# Patient Record
Sex: Female | Born: 1965
Health system: Southern US, Community
[De-identification: ages and names within clinical notes are randomized; demographics above are authoritative.]

## PROBLEM LIST (undated history)

## (undated) DIAGNOSIS — N979 Female infertility, unspecified: Secondary | ICD-10-CM

## (undated) DIAGNOSIS — K219 Gastro-esophageal reflux disease without esophagitis: Secondary | ICD-10-CM

## (undated) DIAGNOSIS — J302 Other seasonal allergic rhinitis: Secondary | ICD-10-CM

## (undated) DIAGNOSIS — F419 Anxiety disorder, unspecified: Secondary | ICD-10-CM

## (undated) DIAGNOSIS — S82831A Other fracture of upper and lower end of right fibula, initial encounter for closed fracture: Secondary | ICD-10-CM

## (undated) DIAGNOSIS — Z8619 Personal history of other infectious and parasitic diseases: Secondary | ICD-10-CM

## (undated) DIAGNOSIS — I499 Cardiac arrhythmia, unspecified: Secondary | ICD-10-CM

## (undated) DIAGNOSIS — J45909 Unspecified asthma, uncomplicated: Secondary | ICD-10-CM

## (undated) DIAGNOSIS — J309 Allergic rhinitis, unspecified: Secondary | ICD-10-CM

## (undated) HISTORY — DX: Other fracture of upper and lower end of right fibula, initial encounter for closed fracture: S82.831A

## (undated) HISTORY — DX: Allergic rhinitis, unspecified: J30.9

## (undated) HISTORY — PX: ANKLE FRACTURE SURGERY: SHX122

## (undated) HISTORY — DX: Gastro-esophageal reflux disease without esophagitis: K21.9

## (undated) HISTORY — PX: COLONOSCOPY: SHX174

## (undated) HISTORY — DX: Unspecified asthma, uncomplicated: J45.909

---

## 1986-11-12 HISTORY — PX: WISDOM TOOTH EXTRACTION: SHX21

## 2000-11-12 HISTORY — PX: CHOLECYSTECTOMY: SHX55

## 2001-03-10 ENCOUNTER — Inpatient Hospital Stay (HOSPITAL_COMMUNITY): Admission: AD | Admit: 2001-03-10 | Discharge: 2001-03-13 | Payer: Self-pay | Admitting: Obstetrics and Gynecology

## 2001-03-14 ENCOUNTER — Encounter: Payer: Self-pay | Admitting: Obstetrics and Gynecology

## 2001-03-14 ENCOUNTER — Inpatient Hospital Stay (HOSPITAL_COMMUNITY): Admission: AD | Admit: 2001-03-14 | Discharge: 2001-03-14 | Payer: Self-pay | Admitting: Obstetrics and Gynecology

## 2001-03-18 ENCOUNTER — Encounter: Admission: RE | Admit: 2001-03-18 | Discharge: 2001-04-17 | Payer: Self-pay | Admitting: Obstetrics and Gynecology

## 2001-04-18 ENCOUNTER — Encounter: Admission: RE | Admit: 2001-04-18 | Discharge: 2001-05-18 | Payer: Self-pay | Admitting: Obstetrics and Gynecology

## 2001-06-16 ENCOUNTER — Ambulatory Visit (HOSPITAL_COMMUNITY): Admission: RE | Admit: 2001-06-16 | Discharge: 2001-06-16 | Payer: Self-pay | Admitting: *Deleted

## 2001-06-16 ENCOUNTER — Encounter: Payer: Self-pay | Admitting: *Deleted

## 2001-06-18 ENCOUNTER — Encounter: Admission: RE | Admit: 2001-06-18 | Discharge: 2001-07-18 | Payer: Self-pay | Admitting: Obstetrics and Gynecology

## 2001-06-27 ENCOUNTER — Encounter (INDEPENDENT_AMBULATORY_CARE_PROVIDER_SITE_OTHER): Payer: Self-pay | Admitting: *Deleted

## 2001-06-27 ENCOUNTER — Ambulatory Visit (HOSPITAL_COMMUNITY): Admission: RE | Admit: 2001-06-27 | Discharge: 2001-06-28 | Payer: Self-pay | Admitting: *Deleted

## 2001-07-19 ENCOUNTER — Encounter: Admission: RE | Admit: 2001-07-19 | Discharge: 2001-08-18 | Payer: Self-pay | Admitting: Obstetrics and Gynecology

## 2001-09-18 ENCOUNTER — Encounter: Admission: RE | Admit: 2001-09-18 | Discharge: 2001-10-18 | Payer: Self-pay | Admitting: Obstetrics and Gynecology

## 2001-11-18 ENCOUNTER — Encounter: Admission: RE | Admit: 2001-11-18 | Discharge: 2001-12-18 | Payer: Self-pay | Admitting: Obstetrics and Gynecology

## 2001-12-19 ENCOUNTER — Encounter: Admission: RE | Admit: 2001-12-19 | Discharge: 2002-01-18 | Payer: Self-pay | Admitting: Obstetrics and Gynecology

## 2002-04-20 ENCOUNTER — Other Ambulatory Visit: Admission: RE | Admit: 2002-04-20 | Discharge: 2002-04-20 | Payer: Self-pay | Admitting: Obstetrics and Gynecology

## 2003-04-22 ENCOUNTER — Other Ambulatory Visit: Admission: RE | Admit: 2003-04-22 | Discharge: 2003-04-22 | Payer: Self-pay | Admitting: Obstetrics and Gynecology

## 2004-08-22 ENCOUNTER — Emergency Department (HOSPITAL_COMMUNITY): Admission: EM | Admit: 2004-08-22 | Discharge: 2004-08-23 | Payer: Self-pay | Admitting: Emergency Medicine

## 2006-07-04 ENCOUNTER — Emergency Department (HOSPITAL_COMMUNITY): Admission: EM | Admit: 2006-07-04 | Discharge: 2006-07-04 | Payer: Self-pay | Admitting: Emergency Medicine

## 2010-08-31 ENCOUNTER — Emergency Department (HOSPITAL_COMMUNITY): Admission: EM | Admit: 2010-08-31 | Discharge: 2010-08-31 | Payer: Self-pay | Admitting: Emergency Medicine

## 2010-10-27 ENCOUNTER — Emergency Department (HOSPITAL_BASED_OUTPATIENT_CLINIC_OR_DEPARTMENT_OTHER)
Admission: EM | Admit: 2010-10-27 | Discharge: 2010-10-27 | Payer: Self-pay | Source: Home / Self Care | Admitting: Emergency Medicine

## 2010-10-29 ENCOUNTER — Inpatient Hospital Stay (HOSPITAL_COMMUNITY)
Admission: EM | Admit: 2010-10-29 | Discharge: 2010-10-30 | Payer: Self-pay | Source: Home / Self Care | Attending: Orthopaedic Surgery | Admitting: Orthopaedic Surgery

## 2010-11-09 ENCOUNTER — Encounter (INDEPENDENT_AMBULATORY_CARE_PROVIDER_SITE_OTHER): Payer: Self-pay | Admitting: Family Medicine

## 2010-11-09 ENCOUNTER — Ambulatory Visit (HOSPITAL_COMMUNITY)
Admission: RE | Admit: 2010-11-09 | Discharge: 2010-11-09 | Payer: Self-pay | Source: Home / Self Care | Attending: Orthopedic Surgery | Admitting: Orthopedic Surgery

## 2010-12-08 NOTE — Op Note (Signed)
Nancy, Richards                ACCOUNT NO.:  [id number]  MEDICAL RECORD NO.:  [id number]          PATIENT TYPE:  INP  LOCATION:  5009                         FACILITY:  [facility]  PHYSICIAN:  Nancy Richards, M.D.DATE OF BIRTH:  December 27, 1965  DATE OF PROCEDURE:  10/29/2010 DATE OF DISCHARGE:                              OPERATIVE REPORT   PREOPERATIVE DIAGNOSIS:  Displaced right distal fibular fracture with diastasis of distal tib-fib joint and tear of deltoid ligament.  POSTOPERATIVE DIAGNOSIS:  Displaced right distal fibular fracture with diastasis of distal tib-fib joint and tear of deltoid ligament.  PROCEDURES: 1. Open reduction internal fixation of distal fibula. 2. Repair of deltoid ligament. 3. Open reduction internal fixation of diastasis.  SURGEON:  Nancy Manges. Cleophas Dunker, MD  ASSISTANT:  Nancy Richards, PAC  ANESTHESIA:  General.  COMPLICATIONS:  None.  DESCRIPTION OF PROCEDURE:  Nancy Richards was met in the holding area.  She was in a posterior splint.  The skin was checked.  There were no open areas or blisters.  Neurovascular exam was intact to the foot.  I marked the right leg as the appropriate operative extremity.  The patient was then transported to room #1 where she was placed under general orotracheal anesthesia without difficulty.  The right lower exam was then placed in a thigh tourniquet.  The leg was prepped with Betadine scrub and the DuraPrep from the tips of the toes to the knees.  Sterile draping was performed.  The extremity still elevated, it was Esmarch exsanguinated with a proximal tourniquet at 350 mmHg.  I initially approached the fibular fracture.  Longitudinal incision was made along the distal left fibula and via sharp dissection, carried down to subcutaneous tissue.  Superficial fascia was incised.  There was hematoma.  Then via blunt dissection, the soft tissue was elevated from the fibula using a 15-blade knife.  The periosteum was  then elevated and the fracture was identified.  Fracture was probably 5-6 inches proximal to the tip of the fibula.  There was comminution of large butterfly fragment posteriorly.  Under direct visualization, the fracture was reduced.  A seven-hole titanium semitubular DePuy plate was then applied to the fibular, maintaining position with the bone clamp.  Each of the 3 holes proximally and the 3 holes distally were drilled, measured, and filled with self-tapping titanium screws.  I placed two 0 Vicryl sutures circumferentially around the fracture to maintain position of the butterfly fragment.  The distal hole in the plate was used to secure the diastasis with the Arthrex TightRope device.  A second incision was then made over the medial malleolus and via sharp dissection, carried down to the subcutaneous tissue.  Superficial fascia was incised.  At that point, we encountered the deltoid ligament that had torn from its attachment to the talus and then flipped proximally over the medial malleolus.  The fascia was incised.  Hematoma was irrigated.  The joint was explored with evidence of loose material.  The posterior tibial tendon was in its normal position.  The deltoid ligament was then repaired.  I inserted a mini Arthrex PEEK anchor using  a 2-0 drill bit.  This was inserted and a 2-0 FiberWire was then used to secure the deep and superficial deltoid ligament to the bone.  I supplemented the repair with sutures posteriorly and anteriorly.  The capsule was also ruptured anteriorly.  This was repaired.  After irrigation, had a very nice repair of the ligament.  The Arthrex TightRope device was then utilized to maintain position of the distal tib-fib joint.  A K-wire was inserted through the very distal hole of the fibular plate directed parallel to the anchor joint and anteriorly exiting along the medial tibia using the same medial incision.  We checked to be sure we had excellent  position on the intensifier and then drilled a hole, appropriate size hole over the K- wire and TightRope device was then inserted through the plate to the fibula into the tibia on the opposite side.  The titanium button was then affixed to the side of the tibia, maintaining reduction of the tib- fib joint and with the foot at neutral dorsiflexion, the TightRope device was then tightened.  We obtained films in various projections and felt that we had excellent position of the plate and TightRope device.  The wounds were then irrigated with saline solution.  The medial incision was closed with 3-0 Monocryl and staples.  The lateral incision was closed with 2-0 Vicryl and then skin clips.  Sterile bulky dressing was applied followed by posterior splint.  Tourniquet was deflated approximately an hour and half with good capillary refill to the toes.  The patient tolerated the procedure without complications.  PLAN:  A 23 hours observation, discharge in a.m. with crutches and Percocet for pain.     Nancy Richards, M.D.     PWW/MEDQ  D:  10/29/2010  T:  10/30/2010  Job:  [id number]  Electronically Signed by Nancy Richards M.D. on 12/06/2010 09:02:38 AM

## 2011-01-22 LAB — COMPREHENSIVE METABOLIC PANEL
ALT: 13 U/L (ref 0–35)
AST: 25 U/L (ref 0–37)
Alkaline Phosphatase: 81 U/L (ref 39–117)
CO2: 22 mEq/L (ref 19–32)
Chloride: 106 mEq/L (ref 96–112)
Creatinine, Ser: 0.61 mg/dL (ref 0.4–1.2)
GFR calc Af Amer: >60 mL/min (ref >60)
GFR calc non Af Amer: >60 mL/min (ref >60)
Potassium: 4.3 mEq/L (ref 3.5–5.1)
Sodium: 134 mEq/L — ABNORMAL LOW (ref 135–145)
Total Bilirubin: 1.3 mg/dL — ABNORMAL HIGH (ref 0.3–1.2)

## 2011-01-22 LAB — DIFFERENTIAL
Basophils Absolute: 0 10*3/uL (ref 0.0–0.1)
Basophils Relative: 0 % (ref 0–1)
Eosinophils Absolute: 0.2 10*3/uL (ref 0.0–0.7)
Eosinophils Relative: 2 % (ref 0–5)
Monocytes Absolute: 0.8 10*3/uL (ref 0.1–1.0)

## 2011-01-22 LAB — CBC
Hemoglobin: 12.7 g/dL (ref 12.0–15.0)
MCH: 30.2 pg (ref 26.0–34.0)
RBC: 4.2 MIL/uL (ref 3.87–5.11)
WBC: 11 10*3/uL — ABNORMAL HIGH (ref 4.0–10.5)

## 2011-01-24 LAB — URINALYSIS, ROUTINE W REFLEX MICROSCOPIC
Bilirubin Urine: NEGATIVE
Ketones, ur: NEGATIVE mg/dL
Nitrite: NEGATIVE
Protein, ur: NEGATIVE mg/dL

## 2011-01-24 LAB — POCT CARDIAC MARKERS: CKMB, poc: <1 ng/mL — ABNORMAL LOW (ref 1.0–8.0)

## 2011-01-24 LAB — DIFFERENTIAL
Basophils Absolute: 0 10*3/uL (ref 0.0–0.1)
Basophils Relative: 0 % (ref 0–1)
Eosinophils Relative: 3 % (ref 0–5)
Lymphocytes Relative: 22 % (ref 12–46)
Monocytes Absolute: 0.9 10*3/uL (ref 0.1–1.0)
Neutro Abs: 9 10*3/uL — ABNORMAL HIGH (ref 1.7–7.7)

## 2011-01-24 LAB — BASIC METABOLIC PANEL
BUN: 9 mg/dL (ref 6–23)
Creatinine, Ser: 0.67 mg/dL (ref 0.4–1.2)
GFR calc non Af Amer: >60 mL/min (ref >60)
Glucose, Bld: 124 mg/dL — ABNORMAL HIGH (ref 70–99)

## 2011-01-24 LAB — CBC
HCT: 37.2 % (ref 36.0–46.0)
MCHC: 33.9 g/dL (ref 30.0–36.0)
MCV: 89.4 fL (ref 78.0–100.0)
Platelets: 277 10*3/uL (ref 150–400)
RDW: 13.7 % (ref 11.5–15.5)
WBC: 13 10*3/uL — ABNORMAL HIGH (ref 4.0–10.5)

## 2011-01-24 LAB — TSH: TSH: 2.595 u[IU]/mL (ref 0.350–4.500)

## 2011-03-30 NOTE — Op Note (Signed)
[facility]  Patient:    Nancy Richards, Nancy Richards                       MRN: [id number] Proc. Date: 03/10/01 Adm. Date:  [id number] Attending:  Leonard Schwartz                           Operative Report  PREOPERATIVE DIAGNOSES:       1. Term intrauterine pregnancy.                               2. Prior cesarean section.                               3. Suspected macrosomia.  POSTOPERATIVE DIAGNOSES:      1. Term intrauterine pregnancy.                               2. Prior cesarean section.                               3. Suspected macrosomia.  OPERATION:                    Repeat low transverse cesarean section.  SURGEON:                      Janine Limbo, M.D.  FIRST ASSISTANT:              Vance Gather Duplantis, C.N.M.  ANESTHESIA:                   Spinal.  DISPOSITION:                  The patient is a 45 year old female gravida 2, para 1, who presents at term.  She has the above mentioned diagnosis.  The patient understands the indications for her surgical procedure, and she accepts the risks of, but not limited to anesthetic complications, bleeding, infection, and possible damage to the surrounding organs.  FINDINGS:                     An 8 pound and 15 ounce female infant was delivered from a cephalic position.  The Apgars were 8 at one minute and 9 at five minutes.  A nuchal cord was present.  The placenta otherwise appeared normal.  The uterus, fallopian tubes, and ovaries were normal for the gravid state.  There were some adhesions between the right ovary and the right posterior uterus.  DESCRIPTION OF PROCEDURE:     The patient was taken to the operating room where a spinal anesthetic was given.  The patients abdomen, perineum, and outer vagina were prepped with multiple layers of Betadine.  A Foley catheter was placed in the bladder.  The patient was sterilely draped.  A low transverse incision was made in the abdomen and  carried sharply through the subcutaneous tissue, the fascia, and the anterior peritoneum.  An incision was made in the lower uterine segment, and extended transversely.  The fetal head was delivered without difficulty.  The mouth and nose were suctioned.  The remainder of the infant was delivered.  Routine cord blood studies were obtained.  The placenta was removed.  The uterine cavity was cleaned of amniotic fluid, clotted blood, and membranes.  The cervix was dilated.  The uterine incision was closed using a running locking suture of 2-0 Vicryl followed by figure-of-eight sutures of 0 Vicryl for hemostasis.  The peritoneal cavity was vigorously irrigated.  Hemostasis was adequate.  The anterior peritoneum and the abdominal musculature were approximated in the midline using a figure-of-eight suture of 2-0 Vicryl.  The fascia and the subcutaneous tissue were irrigated.  Hemostasis was adequate.  The fascia was closed using a running suture of 0 Vicryl followed by three interrupted sutures of 0 Vicryl.  The subcutaneous layer was closed using interrupted sutures of 0 Vicryl.  The skin was reapproximated using skin staples.  Sponge, needle, and instrument counts were correct on two occasions.  The estimated blood loss was 700 cc.  The patient tolerated her procedure well. The patient was returned to the recovery room in stable condition.  The infant was taken to the full-term nursery in stable condition. DD:  03/10/01 TD:  03/10/01 Job: [id number]

## 2011-03-30 NOTE — H&P (Signed)
[facility]  Patient:    Nancy Richards, Nancy Richards                         MRN: [id number] Adm. Date:  03/10/01 Attending:  Janine Limbo, M.D.                         History and Physical  HISTORY OF PRESENT ILLNESS:   The patient is a 45 year old female, gravida 2, para 1-0-0-1, who presents at [redacted] weeks gestation (EDC is March 09, 2001) for repeat cesarean section.  The patient has been followed at [facility] for this pregnancy that has been largely uncomplicated.  The patient did have a cesarean section in 1995 at [redacted] weeks gestation where she delivered a 9 pound and 14 ounce female infant.  She dilated her cervix to 5-6 cm and could dilate no further.  DRUG ALLERGIES:               NO KNOWN DRUG ALLERGIES.  SHE DOES HAVE SEASONAL ALLERGIES.  PAST MEDICAL HISTORY:         The patient had postpartum depression following her other pregnancy.  SOCIAL HISTORY:               The patient denies cigarette use, alcohol use, and recreational drug use.  REVIEW OF SYSTEMS:            Normal pregnancy complaints.  FAMILY HISTORY:               Noncontributory.  PHYSICAL EXAMINATION:  VITAL SIGNS:                  Weight is 198 pounds.  HEENT:                        Within normal limits.  CHEST:                        Clear.  HEART:                        Regular rate and rhythm.  BREASTS:                      Without masses.  ABDOMEN:                      Gravid with a fundal height of 41 cm.  EXTREMITIES:                  Within normal limits.  NEUROLOGIC EXAMINATION:       Normal.  PELVIC EXAMINATION:           Cervix is fingertip, 50% effaced, and -3 in station.  LABORATORY VALUES:            Blood type is A-positive.  Antibody screen negative.  VDRL nonreactive.  Rubella immune.  HBsAg negative.  Alpha fetoprotein within normal limits.  Glucola screen 91.  Third trimester beta Strep negative.  ASSESSMENT:                    1. A 40-week gestation.                               2. Prior  cesarean section.                               3. History of postpartum depression.  PLAN:                         The patient will undergo a repeat low transverse cesarean section.  She understands the indications for her procedure, and she accepts the risks of, but not limited to anesthetic complications, bleeding, infection, and possible damage to the surrounding organs. DD:  03/08/01 TD:  03/08/01 Job: [id number]

## 2011-03-30 NOTE — Op Note (Signed)
[facility]  Patient:    Nancy Richards, Nancy Richards                       MRN: [id number] Proc. Date: 06/27/01 Adm. Date:  [id number] Attending:  Kandis Mannan CC:         Marinda Elk, M.D.  Janine Limbo, M.D.   Operative Report  CCS [phone].  PREOPERATIVE DIAGNOSIS:  Gallbladder polyps.  POSTOPERATIVE DIAGNOSIS:  Gallbladder polyps.  PROCEDURE:  Laparoscopic cholecystectomy.  SURGEON:  Maisie Fus B. Samuella Cota, M.D.  ASSISTANT:  Zigmund Daniel, M.D.  ANESTHESIA:  General.  ANESTHESIOLOGIST:  Maren Beach, M.D., and C.R.N.A.  DESCRIPTION OF PROCEDURE:  Patient was taken to the operating room, placed on the table in supine position.  After satisfactory general anesthetic with intubation, the entire abdomen was prepped and draped as a sterile field.  A small vertical infraumbilical incision was made through the skin and subcutaneous tissue and midline fascia.  A finger was placed into the peritoneal cavity, and there were no adhesions.  A pursestring suture of 0 Vicryl was placed and the Hasson trocar placed into the abdomen, and the abdomen was then insufflated to 14 mmHg pressure.  A second 10 mm trocar was placed just to the right of midline and two 5 mm trocars were placed laterally.  Inspection of the lower abdomen revealed some minor adhesions from her C-section.  The gallbladder had no adhesions to it.  The gallbladder was placed on traction, and the cystic duct and cystic artery were dissected.  The cystic duct seen to be normal size and was of fairly long length.  The cystic duct was clipped once on the gallbladder side and three times on the remaining side.  It was then divided, which exposed one branch of the cystic artery, which was triply clipped on the remaining side, once on the gallbladder side, and divided.  A second artery was seen and treated in a similar manner.  The gallbladder was dissected from the bed using the  cautery.  The gallbladder was entered with one of the graspers, and so the gallbladder was placed into an Endocatch bag and then delivered through the infraumbilical incision without difficulty.  Bleeding was controlled with the cautery.  Right upper quadrant was copiously irrigated with saline, and the irrigant was clear.  The two lateral trocars were removed under direct vision, and there was no bleeding. The pursestring suture at the infraumbilical incision was tied to give good closure of that defect.  The abdomen was deflated, and the second 10 mm trocar was removed.  Marcaine 0.25% without epinephrine had been injected at all trocar sites.  The two midline incisions were closed with running subcuticular sutures of 4-0 Vicryl, and the two lateral trocar sites were closed with single simple inverted 4-0 Vicryl.  Benzoin and half-inch Steri-Strips were used to reinforce the skin closure.  The gallbladder was palpated, and there was a mass in the fundus of the gallbladder.  There was a very narrow opening leading to that area, and this was not opened up but was left for the pathologist to examine.  The patient seemed to tolerate the procedure well and was taken to the PACU in satisfactory condition. DD:  06/27/01 TD:  06/28/01 Job: [id number]

## 2011-03-30 NOTE — Discharge Summary (Signed)
[facility]  Patient:    Nancy Richards, Nancy Richards                       MRN: [id number] Adm. Date:  [id number] Disc. Date: 03/13/01 Attending:  Leonard Schwartz Dictator:   Vance Gather Duplantis, C.N.M.                           Discharge Summary  ADMISSION DIAGNOSES:          1. Term pregnancy.                               2. Prior cesarean section.                               3. Estimated macrosomia on ultrasound.  DISCHARGE DIAGNOSES:          1. Term pregnancy.                               2. Prior cesarean section.                               3. Estimated macrosomia on ultrasound.                               4. Breast-feeding.                               5. Fever blister.  PROCEDURES THIS ADMISSION:                    Repeat low transverse cesarean section for                               delivery of a viable female infant who weighed                               8 pounds 15 ounces and had Apgars of 8 and 9, by                               Dr. Janine Limbo and Saverio Danker,                               CNM on March 10, 2001.  HOSPITAL COURSE:              Nancy Richards is a 45 year old married white female, gravida 2, para 1-0-0-1 who presents at [redacted] weeks gestation for a repeat low transverse cesarean section secondary to a history of macrosomia with her previous pregnancy and estimated macrosomia with this pregnancy. She desires to proceed with a repeat cesarean section. She underwent the same for delivery of a viable female infant who weighed 8 pounds 15 ounces and had Apgars of 8 and 9 on March 10, 2001 attended by Dr. Janine Limbo and Vance Gather Duplantis, CNM.  Postoperatively, she has done well. She is ambulating, voiding, and eating without difficulty. Her vital signs are stable and she is afebrile. She is breast-feeding, also without difficulty. She reports that they plan a vasectomy for contraception. She  complains of a cold sore or fever blister on her lip today and requests some medication for that prior to discharge. She is otherwise deemed ready for discharge today.  DISCHARGE INSTRUCTIONS:       Her discharge instructions are as per the [facility] handout.  DISCHARGE MEDICATIONS:        1. Denavir cream for her lip.                               2. Tylox one to two p.o. q.4-6h. p.r.n. for                                  pain.                               3. Motrin 600 mg p.o. q.6h. p.r.n. for pain.                               4. Prenatal vitamins daily.                               5. Ferrous sulfate daily.  DISCHARGE LABORATORY DATA:    Her hemoglobin is 9.7, her WBC count is 18.7, and her platelets are 241,000.  DISCHARGE FOLLOWUP:           Her discharge followup will be in six weeks at [facility] or p.r.n.   DD:  03/13/01 TD:  03/13/01 Job: [id number]

## 2012-11-12 HISTORY — PX: SHOULDER SURGERY: SHX246

## 2014-06-26 ENCOUNTER — Encounter: Payer: Self-pay | Admitting: *Deleted

## 2014-12-09 ENCOUNTER — Other Ambulatory Visit: Payer: Self-pay | Admitting: Allergy and Immunology

## 2014-12-09 ENCOUNTER — Ambulatory Visit
Admission: RE | Admit: 2014-12-09 | Discharge: 2014-12-09 | Disposition: A | Discharge status: Home / Self Care | Payer: Managed Care, Other (non HMO) | Source: Ambulatory Visit | Attending: Allergy and Immunology | Admitting: Allergy and Immunology

## 2014-12-09 DIAGNOSIS — J4530 Mild persistent asthma, uncomplicated: Secondary | ICD-10-CM

## 2015-08-10 ENCOUNTER — Other Ambulatory Visit: Payer: Self-pay | Admitting: Surgery

## 2015-08-10 NOTE — H&P (Signed)
Nancy Richards 08/10/2015 8:50 AM Location: [facility] Patient #: [id number] DOB: Dec 12, 1965 Married / Language: [place] / Race: Undefined Female History of Present Illness Nancy Sportsman MD; 08/10/2015 9:53 AM) The patient is a 49 year old female who presents with hemorrhoids. Patient comes for surgical consultation at the request of her gynecologist, Dr. Billy Richards. Worsening hemorrhoids. Anal pain. Pleasant obese female with some issues of constipation and diarrhea with her cholecystectomy and other health issues. She struggled with hemorrhoids for many years. Usually mild and tolerable. However became more bleeding in instead of intermittently coming out been chronically out. She's had chronic hemorrhoids on the outside for over a year. However she's having worsening problems of bleeding and pain. Sometimes she has very sharp pain with bowel movements. Struggles with some constipation. Usually takes handful of knots to help stimulate a bowel movement and that helps. Been taking Anusol cream with the help of her gynecologist but still struggles. Has had a colonoscopy by [facility] gastroenterology earlier this year that apparently was normal. Richards pending. No history of irritable bowel or inflammatory bowel disease. No Crohn's. No ulcerative colitis. She did have a cholecystectomy done 15 years ago. Some regular bowel since then. Nancy Richards upper abdominal pain but nothing severe. No nausea or vomiting. No family history of colorectal cancer or inflammatory bowel disease that she is aware of. Other Problems Nancy Richards, CMA; 08/10/2015 8:50 AM) Asthma Gastroesophageal Reflux Disease Hemorrhoids  Past Surgical History Nancy Richards, CMA; 08/10/2015 8:50 AM) Cesarean Section - Multiple Foot Surgery Bilateral. Gallbladder Surgery - Laparoscopic Oral Surgery Shoulder Surgery Left.  Diagnostic Studies History Nancy Richards, [place]; 08/10/2015 8:50 AM) Colonoscopy within last  year Mammogram within last year Pap Smear 1-5 years ago  Allergies Nancy Richards, CMA; 08/10/2015 8:51 AM) Aspirin *ANALGESICS - NonNarcotic*  Medication History Nancy Richards, CMA; 08/10/2015 8:53 AM) Denavir (1% Cream, External) Active. Protonix (  Tablet DR, Oral) Active. Multiple Vitamin (Oral) Active. Asmanex HFA (200MCG/ACT Aerosol, Inhalation) Active. Toprol XL (  Tablet ER, Oral) Active. Meclizine HCl (  Tablet, Oral) Active. Allegra (  Tablet, Oral) Active. KlonoPIN (  Tablet, Oral) Active. Albuterol Sulfate HFA (108MCG/ACT Aerosol Soln, Inhalation) Active. Zovirax (  Tablet, Oral) Active. Medications Reconciled  Social History Nancy Richards, [place]; 08/10/2015 8:50 AM) Alcohol use Occasional alcohol use. Caffeine use Coffee. No drug use Tobacco use Former smoker.  Family History Nancy Richards, [place]; 08/10/2015 8:50 AM) Arthritis Mother. Diabetes Mellitus Brother, Father, Sister. Respiratory Condition Mother.  Pregnancy / Birth History Nancy Richards, CMA; 08/10/2015 8:50 AM) Age at menarche 12 years. Age of menopause 55-55 Gravida 2 Irregular periods Maternal age 58-30 Para 2     Review of Systems Nancy Richards CMA; 08/10/2015 8:50 AM) General Not Present- Appetite Loss, Chills, Fatigue, Fever, Night Sweats, Weight Gain and Weight Loss. Skin Not Present- Change in Wart/Mole, Dryness, Hives, Jaundice, New Lesions, Non-Healing Wounds, Rash and Ulcer. HEENT Present- Seasonal Allergies and Wears glasses/contact lenses. Not Present- Earache, Hearing Loss, Hoarseness, Nose Bleed, Oral Ulcers, Ringing in the Ears, Sinus Pain, Sore Throat, Visual Disturbances and Yellow Eyes. Respiratory Present- Chronic Cough and Snoring. Not Present- Bloody sputum, Difficulty Breathing and Wheezing. Breast Not Present- Breast Mass, Breast Pain, Nipple Discharge and Skin Changes. Gastrointestinal Present- Abdominal Pain, Change in Bowel Habits,  Hemorrhoids and Rectal Pain. Not Present- Bloating, Bloody Stool, Chronic diarrhea, Constipation, Difficulty Swallowing, Excessive gas, Gets full quickly at meals, Indigestion, Nausea and Vomiting. Female Genitourinary Present- Frequency and Urgency. Not Present- Nocturia, Painful Urination and Pelvic Pain. Musculoskeletal Present- Joint  Stiffness. Not Present- Back Pain, Joint Pain, Muscle Pain, Muscle Weakness and Swelling of Extremities. Neurological Not Present- Decreased Memory, Fainting, Headaches, Numbness, Seizures, Tingling, Tremor, Trouble walking and Weakness. Psychiatric Not Present- Anxiety, Bipolar, Change in Sleep Pattern, Depression, Fearful and Frequent crying. Endocrine Present- Hot flashes. Not Present- Cold Intolerance, Excessive Hunger, Hair Changes, Heat Intolerance and New Diabetes. Hematology Not Present- Easy Bruising, Excessive bleeding, Gland problems, HIV and Persistent Infections.  Vitals Nancy Richards CMA; 08/10/2015 8:54 AM) 08/10/2015 8:53 AM Weight: 211 lb Height: 64in Body Surface Area: 2.08 m Body Mass Index: 36.22 kg/m Temp.: 97.79F(Temporal)  Pulse: 84 (Regular)  BP: 128/78 (Sitting, Left Arm, Standard)     Physical Exam Nancy Sportsman MD; 08/10/2015 9:40 AM)  General Mental Status-Alert. General Appearance-Not in acute distress, Not Sickly. Orientation-Oriented X3. Hydration-Well hydrated. Voice-Normal.  Integumentary Global Assessment Upon inspection and palpation of skin surfaces of the - Axillae: non-tender, no inflammation or ulceration, no drainage. and Distribution of scalp and body hair is normal. General Characteristics Temperature - normal warmth is noted.  Head and Neck Head-normocephalic, atraumatic with no lesions or palpable masses. Face Global Assessment - atraumatic, no absence of expression. Neck Global Assessment - no abnormal movements, no bruit auscultated on the right, no bruit auscultated on the  left, no decreased range of motion, non-tender. Trachea-midline. Thyroid Gland Characteristics - non-tender.  Eye Eyeball - Left-Extraocular movements intact, No Nystagmus. Eyeball - Right-Extraocular movements intact, No Nystagmus. Cornea - Left-No Hazy. Cornea - Right-No Hazy. Sclera/Conjunctiva - Left-No scleral icterus, No Discharge. Sclera/Conjunctiva - Right-No scleral icterus, No Discharge. Pupil - Left-Direct reaction to light normal. Pupil - Right-Direct reaction to light normal.  ENMT Ears Pinna - Left - no drainage observed, no generalized tenderness observed. Right - no drainage observed, no generalized tenderness observed. Nose and Sinuses External Inspection of the Nose - no destructive lesion observed. Inspection of the nares - Left - quiet respiration. Right - quiet respiration. Mouth and Throat Lips - Upper Lip - no fissures observed, no pallor noted. Lower Lip - no fissures observed, no pallor noted. Nasopharynx - no discharge present. Oral Cavity/Oropharynx - Tongue - no dryness observed. Oral Mucosa - no cyanosis observed. Hypopharynx - no evidence of airway distress observed.  Chest and Lung Exam Inspection Movements - Normal and Symmetrical. Accessory muscles - No use of accessory muscles in breathing. Palpation Palpation of the chest reveals - Non-tender. Auscultation Breath sounds - Normal and Clear.  Cardiovascular Auscultation Rhythm - Regular. Murmurs & Other Heart Sounds - Auscultation of the heart reveals - No Murmurs and No Systolic Clicks.  Abdomen Inspection Inspection of the abdomen reveals - No Visible peristalsis and No Abnormal pulsations. Umbilicus - No Bleeding, No Urine drainage. Palpation/Percussion Palpation and Percussion of the abdomen reveal - Soft, Non Tender, No Rebound tenderness, No Rigidity (guarding) and No Cutaneous hyperesthesia. Note: Laparoscopic cholecystectomy laparoscopic incisions with normal healing  ridges. No cellulitis. No drainage. No significant ecchymosis. No guarding. Mild suprapubic umbilical diastases recti. No umbilical hernia. Mild epigastric and RIGHT upper quadrant discomfort but no things severe   Female Genitourinary Sexual Maturity Tanner 5 - Adult hair pattern. Note: No vaginal bleeding nor discharge. No inguinal hernias. No lymphadenopathy. No condyloma.   Rectal Note: Exam done with assistance of female Medical Assistant in the room. Perianal skin clean with good hygiene. No pruritis ani. No pilonidal disease. 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 No abscess/fistula.  Normal sphincter tone. Tolerates digital  and anoscopic rectal exam. No rectal masses.  External hemorrhoids: Prominent RIGHT and LEFT anterior external hemorrhoids. Some RIGHT posterior as well.  Hemorrhoidal piles: RIGHT posterior and anterior grade 3 hemorrhoids. Grade 2 LEFT lateral   Peripheral Vascular Upper Extremity Inspection - Left - No Cyanotic nailbeds, Not Ischemic. Right - No Cyanotic nailbeds, Not Ischemic.  Neurologic Neurologic evaluation reveals -normal attention span and ability to concentrate, able to name objects and repeat phrases. Appropriate fund of knowledge , normal sensation and normal coordination. Mental Status Affect - not angry, not paranoid. Cranial Nerves-Normal Bilaterally. Gait-Normal.  Neuropsychiatric Mental status exam performed with findings of-able to articulate well with normal speech/language, rate, volume and coherence, thought content normal with ability to perform basic computations and apply abstract reasoning and no evidence of hallucinations, delusions, obsessions or homicidal/suicidal ideation.  Musculoskeletal Global Assessment Spine, Ribs and Pelvis - no instability, subluxation or laxity. Right Upper Extremity - no instability, subluxation or laxity.  Lymphatic Head &  Neck  General Head & Neck Lymphatics: Bilateral - Description - No Localized lymphadenopathy. Axillary  General Axillary Region: Bilateral - Description - No Localized lymphadenopathy. Femoral & Inguinal  Generalized Femoral & Inguinal Lymphatics: Left - Description - No Localized lymphadenopathy. Right - Description - No Localized lymphadenopathy.    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 Pt Education - Pamphlet Given - The Hemorrhoid Book: discussed with patient and provided information. You are being scheduled for surgery - Our schedulers will call you.  You should hear from our office's scheduling department within 5 working days about the location, date, and time of surgery. We try to make accommodations for patient's preferences in scheduling surgery, but sometimes the OR schedule or the surgeon's schedule prevents [place] from making those accommodations.  If you have not heard from our office [phone]) in 5 working days, call the office and ask for your surgeon's nurse.  If you have other questions about your diagnosis, plan, or surgery, call the office and ask for your surgeon's nurse.   The anatomy & physiology of the anorectal region was discussed. The pathophysiology of hemorrhoids and differential diagnosis was discussed. Natural history risks without surgery was discussed. I stressed the importance of a bowel regimen to have daily soft bowel movements to minimize progression of disease. Interventions such as sclerotherapy & banding were discussed.  The patient's symptoms are not adequately controlled by medicines and other non-operative treatments. I feel the risks & problems of no surgery outweigh the operative risks; therefore, I recommended surgery to treat the hemorrhoids by ligation, pexy, and  possible resection.  Risks such as bleeding, infection, urinary difficulties, need for further treatment, heart attack, death, and other risks were discussed. I noted a good likelihood this will help address the problem. Goals of post-operative recovery were discussed as well. Possibility that this will not correct all symptoms was explained. Post-operative pain, bleeding, constipation, and other problems after surgery were discussed. We will work to minimize complications. Educational handouts further explaining the pathology, treatment options, and bowel regimen were given as well. Questions were answered. The patient expresses understanding & wishes to proceed with surgery. Pt Education - [facility] Rectal Prep for Anorectal outpatient/office surgery: discussed with patient and provided information. Started Anusol-HC 2.5% Cream twice daily, as needed. EXTERNAL HEMORRHOID (K64.4)  Current Plans ANOSCOPY, DIAGNOSTIC (56213) ANAL FISSURE (K60.2)  Current  Plans Pt Education - CCS Anal Fissure (Gross)   The anatomy & physiology of the anorectal region was discussed. The pathophysiology of anal fissure and differential diagnosis was discussed. Natural history progression was discussed. I stressed the importance of a bowel regimen to have daily soft bowel movements to minimize progression of disease.  The patient's condition is not adequately controlled. Non-operative treatment has not healed the fissure. Therefore, I recommended examination under anesthesia for better examination to confirm the diagnosis and treat by lateral internal sphincterotomy to relax the spasm better & allow the fissure to heal. Technique, benefits, alternatives were discussed. I noted a good likelihood this will help address the problem. Risks such as bleeding, pain, incontinence, recurrence, heart attack, death, and other risks were discussed.  Educational handouts further explaining the pathology, treatment options, and bowel  regimen were given as well. The patient expressed understanding & wishes to proceed with surgery.  Nancy Richards, M.D., F.A.C.S. Gastrointestinal and Minimally Invasive Surgery [facility] [address], [address] [place], [place] [address] [phone] Main / Paging

## 2016-09-22 LAB — BASIC METABOLIC PANEL: GLUCOSE: 81 mg/dL

## 2017-08-02 ENCOUNTER — Other Ambulatory Visit: Payer: Self-pay | Admitting: Obstetrics and Gynecology

## 2017-08-05 NOTE — Patient Instructions (Addendum)
Your procedure is scheduled on:  Monday, Oct 1st  Enter through the Main Entrance of [facility] at:  7:45 am  Pick up the phone at the desk and dial [phone].  Call this number if you have problems the morning of surgery: [phone].  Remember: Do NOT eat food after midnight Sunday  Do NOT drink clear liquids (including water) after midnight Sunday  Take these medicines the morning of surgery with a SIP OF WATER: None  Bring albuterol inhaler with you on day of surgery.  Do NOT wear jewelry (body piercing), metal hair clips/bobby pins, make-up, or nail polish. Do NOT wear lotions, powders, or perfumes.  You may wear deoderant. Do NOT shave for 48 hours prior to surgery. Do NOT bring valuables to the hospital.  Leave suitcase in car.  After surgery it may be brought to your room.  For patients admitted to the hospital, checkout time is 11:00 AM the day of discharge. Have a responsible adult drive you home and stay with you for 24 hours after your procedure.  Home with Husband Harvie Heck cell [phone].

## 2017-08-09 ENCOUNTER — Encounter (HOSPITAL_COMMUNITY)
Admission: RE | Admit: 2017-08-09 | Discharge: 2017-08-09 | Disposition: A | Discharge status: Home / Self Care | Payer: Managed Care, Other (non HMO) | Source: Ambulatory Visit | Attending: Obstetrics and Gynecology | Admitting: Obstetrics and Gynecology

## 2017-08-09 ENCOUNTER — Other Ambulatory Visit: Payer: Self-pay

## 2017-08-09 ENCOUNTER — Encounter (HOSPITAL_COMMUNITY): Payer: Self-pay

## 2017-08-09 DIAGNOSIS — J45909 Unspecified asthma, uncomplicated: Secondary | ICD-10-CM | POA: Diagnosis not present

## 2017-08-09 DIAGNOSIS — Z79899 Other long term (current) drug therapy: Secondary | ICD-10-CM | POA: Diagnosis not present

## 2017-08-09 DIAGNOSIS — Z87891 Personal history of nicotine dependence: Secondary | ICD-10-CM | POA: Diagnosis not present

## 2017-08-09 DIAGNOSIS — Z91040 Latex allergy status: Secondary | ICD-10-CM | POA: Diagnosis not present

## 2017-08-09 DIAGNOSIS — K219 Gastro-esophageal reflux disease without esophagitis: Secondary | ICD-10-CM | POA: Diagnosis not present

## 2017-08-09 DIAGNOSIS — F419 Anxiety disorder, unspecified: Secondary | ICD-10-CM | POA: Diagnosis not present

## 2017-08-09 DIAGNOSIS — N840 Polyp of corpus uteri: Secondary | ICD-10-CM | POA: Diagnosis not present

## 2017-08-09 DIAGNOSIS — N950 Postmenopausal bleeding: Secondary | ICD-10-CM | POA: Diagnosis not present

## 2017-08-09 HISTORY — DX: Personal history of other infectious and parasitic diseases: Z86.19

## 2017-08-09 HISTORY — DX: Cardiac arrhythmia, unspecified: I49.9

## 2017-08-09 HISTORY — DX: Other seasonal allergic rhinitis: J30.2

## 2017-08-09 HISTORY — DX: Anxiety disorder, unspecified: F41.9

## 2017-08-09 HISTORY — DX: Female infertility, unspecified: N97.9

## 2017-08-09 LAB — BASIC METABOLIC PANEL
Anion gap: 5 (ref 5–15)
BUN: 12 mg/dL (ref 6–20)
CALCIUM: 8.9 mg/dL (ref 8.9–10.3)
CO2: 26 mmol/L (ref 22–32)
CREATININE: 0.66 mg/dL (ref 0.44–1.00)
Chloride: 108 mmol/L (ref 101–111)
GFR calc non Af Amer: >60 mL/min (ref >60)
Glucose, Bld: 88 mg/dL (ref 65–99)
Potassium: 4.3 mmol/L (ref 3.5–5.1)
SODIUM: 139 mmol/L (ref 135–145)

## 2017-08-09 LAB — CBC
HCT: 38.7 % (ref 36.0–46.0)
Hemoglobin: 12.8 g/dL (ref 12.0–15.0)
MCH: 29.4 pg (ref 26.0–34.0)
MCHC: 33.1 g/dL (ref 30.0–36.0)
MCV: 89 fL (ref 78.0–100.0)
PLATELETS: 326 10*3/uL (ref 150–400)
RBC: 4.35 MIL/uL (ref 3.87–5.11)
RDW: 14.5 % (ref 11.5–15.5)
WBC: 10.1 10*3/uL (ref 4.0–10.5)

## 2017-08-12 ENCOUNTER — Encounter (HOSPITAL_COMMUNITY)
Admission: AD | Disposition: A | Discharge status: Home / Self Care | Payer: Self-pay | Source: Ambulatory Visit | Attending: Obstetrics and Gynecology

## 2017-08-12 ENCOUNTER — Encounter (HOSPITAL_COMMUNITY): Payer: Self-pay | Admitting: Anesthesiology

## 2017-08-12 ENCOUNTER — Ambulatory Visit (HOSPITAL_COMMUNITY): Payer: Managed Care, Other (non HMO) | Admitting: Anesthesiology

## 2017-08-12 ENCOUNTER — Ambulatory Visit (HOSPITAL_COMMUNITY)
Admission: AD | Admit: 2017-08-12 | Discharge: 2017-08-12 | Disposition: A | Discharge status: Home / Self Care | Payer: Managed Care, Other (non HMO) | Source: Ambulatory Visit | Attending: Obstetrics and Gynecology | Admitting: Obstetrics and Gynecology

## 2017-08-12 DIAGNOSIS — Z91040 Latex allergy status: Secondary | ICD-10-CM | POA: Insufficient documentation

## 2017-08-12 DIAGNOSIS — J45909 Unspecified asthma, uncomplicated: Secondary | ICD-10-CM | POA: Insufficient documentation

## 2017-08-12 DIAGNOSIS — Z87891 Personal history of nicotine dependence: Secondary | ICD-10-CM | POA: Insufficient documentation

## 2017-08-12 DIAGNOSIS — F419 Anxiety disorder, unspecified: Secondary | ICD-10-CM | POA: Insufficient documentation

## 2017-08-12 DIAGNOSIS — N950 Postmenopausal bleeding: Secondary | ICD-10-CM | POA: Diagnosis not present

## 2017-08-12 DIAGNOSIS — K219 Gastro-esophageal reflux disease without esophagitis: Secondary | ICD-10-CM | POA: Insufficient documentation

## 2017-08-12 DIAGNOSIS — Z79899 Other long term (current) drug therapy: Secondary | ICD-10-CM | POA: Insufficient documentation

## 2017-08-12 DIAGNOSIS — N840 Polyp of corpus uteri: Secondary | ICD-10-CM | POA: Insufficient documentation

## 2017-08-12 HISTORY — PX: DILATATION & CURETTAGE/HYSTEROSCOPY WITH MYOSURE: SHX6511

## 2017-08-12 SURGERY — DILATATION & CURETTAGE/HYSTEROSCOPY WITH MYOSURE
Anesthesia: General

## 2017-08-12 MED ORDER — MIDAZOLAM HCL 2 MG/2ML IJ SOLN
INTRAMUSCULAR | Status: AC
  Filled 2017-08-12: qty 2

## 2017-08-12 MED ORDER — MIDAZOLAM HCL 2 MG/2ML IJ SOLN
INTRAMUSCULAR | Status: DC | PRN
Start: 2017-08-12 — End: 2017-08-12
  Administered 2017-08-12: 1 mg via INTRAVENOUS

## 2017-08-12 MED ORDER — EPHEDRINE SULFATE 50 MG/ML IJ SOLN
INTRAMUSCULAR | Status: DC | PRN
  Administered 2017-08-12 (×2): 5 mg via INTRAVENOUS

## 2017-08-12 MED ORDER — TRAMADOL HCL 50 MG PO TABS: 50.0000 mg | ORAL_TABLET | Freq: Four times a day (QID) | ORAL | 0 refills | Status: DC | PRN

## 2017-08-12 MED ORDER — LIDOCAINE HCL (CARDIAC) 20 MG/ML IV SOLN
INTRAVENOUS | Status: DC | PRN
  Administered 2017-08-12: 80 mg via INTRAVENOUS

## 2017-08-12 MED ORDER — METOCLOPRAMIDE HCL 5 MG/ML IJ SOLN
INTRAMUSCULAR | Status: DC | PRN
  Administered 2017-08-12: 10 mg via INTRAVENOUS

## 2017-08-12 MED ORDER — METOCLOPRAMIDE HCL 5 MG/ML IJ SOLN: 10.0000 mg | Freq: Once | INTRAMUSCULAR | Status: DC | PRN

## 2017-08-12 MED ORDER — LACTATED RINGERS IV SOLN
INTRAVENOUS | Status: DC
  Administered 2017-08-12 (×2): via INTRAVENOUS

## 2017-08-12 MED ORDER — GLYCOPYRROLATE 0.2 MG/ML IJ SOLN
INTRAMUSCULAR | Status: DC | PRN
  Administered 2017-08-12: 0.1 mg via INTRAVENOUS

## 2017-08-12 MED ORDER — VASOPRESSIN 20 UNIT/ML IV SOLN
INTRAVENOUS | Status: AC
  Filled 2017-08-12: qty 1

## 2017-08-12 MED ORDER — BUPIVACAINE HCL (PF) 0.25 % IJ SOLN
INTRAMUSCULAR | Status: AC
  Filled 2017-08-12: qty 30

## 2017-08-12 MED ORDER — DEXAMETHASONE SODIUM PHOSPHATE 4 MG/ML IJ SOLN
INTRAMUSCULAR | Status: DC | PRN
  Administered 2017-08-12: 10 mg via INTRAVENOUS

## 2017-08-12 MED ORDER — DEXAMETHASONE SODIUM PHOSPHATE 10 MG/ML IJ SOLN
INTRAMUSCULAR | Status: AC
  Filled 2017-08-12: qty 1

## 2017-08-12 MED ORDER — LACTATED RINGERS IV SOLN: INTRAVENOUS | Status: DC

## 2017-08-12 MED ORDER — SCOPOLAMINE 1 MG/3DAYS TD PT72
1.0000 | MEDICATED_PATCH | Freq: Once | TRANSDERMAL | Status: DC
  Administered 2017-08-12: 1.5 mg via TRANSDERMAL

## 2017-08-12 MED ORDER — FENTANYL CITRATE (PF) 250 MCG/5ML IJ SOLN
INTRAMUSCULAR | Status: DC | PRN
  Administered 2017-08-12 (×2): 50 ug via INTRAVENOUS

## 2017-08-12 MED ORDER — SCOPOLAMINE 1 MG/3DAYS TD PT72
MEDICATED_PATCH | TRANSDERMAL | Status: AC
  Filled 2017-08-12: qty 1

## 2017-08-12 MED ORDER — PROPOFOL 10 MG/ML IV BOLUS
INTRAVENOUS | Status: AC
  Filled 2017-08-12: qty 40

## 2017-08-12 MED ORDER — SODIUM CHLORIDE 0.9 % IR SOLN
Status: DC | PRN
  Administered 2017-08-12: 3000 mL

## 2017-08-12 MED ORDER — FENTANYL CITRATE (PF) 100 MCG/2ML IJ SOLN
INTRAMUSCULAR | Status: AC
  Filled 2017-08-12: qty 4

## 2017-08-12 MED ORDER — CEFAZOLIN SODIUM-DEXTROSE 2-4 GM/100ML-% IV SOLN
2.0000 g | INTRAVENOUS | Status: AC
  Administered 2017-08-12: 2 g via INTRAVENOUS

## 2017-08-12 MED ORDER — KETOROLAC TROMETHAMINE 30 MG/ML IJ SOLN
INTRAMUSCULAR | Status: DC | PRN
  Administered 2017-08-12: 30 mg via INTRAVENOUS

## 2017-08-12 MED ORDER — VASOPRESSIN 20 UNIT/ML IV SOLN
INTRAVENOUS | Status: DC | PRN
  Administered 2017-08-12: 20 [IU] via SUBCUTANEOUS

## 2017-08-12 MED ORDER — SODIUM CHLORIDE 0.9 % IJ SOLN
INTRAMUSCULAR | Status: AC
  Filled 2017-08-12: qty 50

## 2017-08-12 MED ORDER — PROPOFOL 10 MG/ML IV BOLUS
INTRAVENOUS | Status: DC | PRN
  Administered 2017-08-12: 50 mg via INTRAVENOUS
  Administered 2017-08-12: 150 mg via INTRAVENOUS

## 2017-08-12 MED ORDER — SODIUM CHLORIDE 0.9 % IJ SOLN
INTRAMUSCULAR | Status: DC | PRN
  Administered 2017-08-12: 50 mL

## 2017-08-12 MED ORDER — FENTANYL CITRATE (PF) 100 MCG/2ML IJ SOLN: 25.0000 ug | INTRAMUSCULAR | Status: DC | PRN

## 2017-08-12 MED ORDER — BUPIVACAINE HCL (PF) 0.25 % IJ SOLN
INTRAMUSCULAR | Status: DC | PRN
  Administered 2017-08-12: 20 mL

## 2017-08-12 MED ORDER — LIDOCAINE HCL (CARDIAC) 20 MG/ML IV SOLN
INTRAVENOUS | Status: AC
  Filled 2017-08-12: qty 5

## 2017-08-12 MED ORDER — ONDANSETRON HCL 4 MG/2ML IJ SOLN
INTRAMUSCULAR | Status: AC
  Filled 2017-08-12: qty 2

## 2017-08-12 MED ORDER — GLYCOPYRROLATE 0.2 MG/ML IJ SOLN
INTRAMUSCULAR | Status: AC
  Filled 2017-08-12: qty 1

## 2017-08-12 MED ORDER — PHENYLEPHRINE HCL 10 MG/ML IJ SOLN
INTRAMUSCULAR | Status: DC | PRN
  Administered 2017-08-12 (×3): 80 ug via INTRAVENOUS

## 2017-08-12 MED ORDER — LIDOCAINE HCL 2 % EX GEL
CUTANEOUS | Status: AC
  Filled 2017-08-12: qty 5

## 2017-08-12 MED ORDER — ONDANSETRON HCL 4 MG/2ML IJ SOLN
INTRAMUSCULAR | Status: DC | PRN
  Administered 2017-08-12: 4 mg via INTRAVENOUS

## 2017-08-12 MED ORDER — MEPERIDINE HCL 25 MG/ML IJ SOLN: 6.2500 mg | INTRAMUSCULAR | Status: DC | PRN

## 2017-08-12 SURGICAL SUPPLY — 21 items
CANISTER SUCT 3000ML PPV (MISCELLANEOUS) ×3 IMPLANT
CATH ROBINSON RED A/P 16FR (CATHETERS) ×3 IMPLANT
CONTAINER PREFILL 10% NBF 60ML (FORM) ×6 IMPLANT
DECANTER SPIKE VIAL GLASS SM (MISCELLANEOUS) ×6 IMPLANT
DEVICE MYOSURE LITE (MISCELLANEOUS) ×2 IMPLANT
DEVICE MYOSURE REACH (MISCELLANEOUS) IMPLANT
FILTER ARTHROSCOPY CONVERTOR (FILTER) ×3 IMPLANT
GLOVE BIO SURGEON STRL SZ7.5 (GLOVE) ×3 IMPLANT
GLOVE BIOGEL PI IND STRL 7.0 (GLOVE) ×1 IMPLANT
GLOVE BIOGEL PI INDICATOR 7.0 (GLOVE) ×2
GOWN STRL REUS W/TWL LRG LVL3 (GOWN DISPOSABLE) ×6 IMPLANT
NDL SPNL 22GX3.5 QUINCKE BK (NEEDLE) ×2 IMPLANT
NEEDLE SPNL 22GX3.5 QUINCKE BK (NEEDLE) ×6 IMPLANT
PACK VAGINAL MINOR WOMEN LF (CUSTOM PROCEDURE TRAY) ×3 IMPLANT
PAD OB MATERNITY 4.3X12.25 (PERSONAL CARE ITEMS) ×3 IMPLANT
SEAL ROD LENS SCOPE MYOSURE (ABLATOR) ×3 IMPLANT
SYR CONTROL 10ML LL (SYRINGE) ×3 IMPLANT
SYR TB 1ML 25GX5/8 (SYRINGE) ×3 IMPLANT
TOWEL OR 17X24 6PK STRL BLUE (TOWEL DISPOSABLE) ×6 IMPLANT
TUBING AQUILEX INFLOW (TUBING) ×3 IMPLANT
TUBING AQUILEX OUTFLOW (TUBING) ×3 IMPLANT

## 2017-08-12 NOTE — Progress Notes (Signed)
Patient seen and examined. Consent witnessed and signed. No changes noted. Update completed. 

## 2017-08-12 NOTE — Op Note (Signed)
08/12/2017  10:05 AM  PATIENT:  Nancy Richards  51 y.o. female  PRE-OPERATIVE DIAGNOSIS:  Postmenopausal Bleeding  POST-OPERATIVE DIAGNOSIS:  Postmenopausal Bleeding  PROCEDURE:  Procedure(s): DILATATION & CURETTAGE OPERATIVE HYSTEROSCOPY WITH MYOSURE RESECTION OF LARGE ENDOMETRIAL POLYP  SURGEON:  Surgeon(s): Olivia Mackie, MD  ASSISTANTS: none   ANESTHESIA:   local and general  ESTIMATED BLOOD LOSS: 50CC  DRAINS: none   LOCAL MEDICATIONS USED:  MARCAINE    and Amount: 20 ml  SPECIMEN:  Source of Specimen:  POLYP AND EMC  DISPOSITION OF SPECIMEN:  PATHOLOGY  COUNTS:  YES  DICTATION #: [id number]  PLAN OF CARE: DC HOME  PATIENT DISPOSITION:  PACU - hemodynamically stable.

## 2017-08-12 NOTE — Discharge Instructions (Signed)

## 2017-08-12 NOTE — Transfer of Care (Signed)
Immediate Anesthesia Transfer of Care Note  Patient: Nancy Richards  Procedure(s) Performed: DILATATION & CURETTAGE/HYSTEROSCOPY WITH MYOSURE (N/A )  Patient Location: PACU  Anesthesia Type:General  Level of Consciousness: awake, alert  and oriented  Airway & Oxygen Therapy: Patient Spontanous Breathing and Patient connected to nasal cannula oxygen  Post-op Assessment: Report given to RN and Post -op Vital signs reviewed and stable  Post vital signs: Reviewed and stable  Last Vitals:  Vitals:   08/12/17 0755  BP: 117/70  Pulse: 97  Resp: 16  Temp: 36.9 C  SpO2: 97%    Last Pain:  Vitals:   08/12/17 0755  TempSrc: Oral         Complications: No apparent anesthesia complications

## 2017-08-12 NOTE — H&P (Signed)
Nancy Richards is an 51 y.o. female. AUB for Diag HS  Pertinent Gynecological History: Menses: regular every month without intermenstrual spotting Bleeding: intermenstrual bleeding Contraception: none DES exposure: denies Blood transfusions: none Sexually transmitted diseases: no past history Previous GYN Procedures: DNC  Last mammogram: normal Date: 2018 Last pap: normal Date: 2018 OB History: G2, P2   Menstrual History: Menarche age: 12 No LMP recorded. Patient is postmenopausal.    Past Medical History:  Diagnosis Date  . Allergic rhinitis   . Anxiety   . Asthma   . Dysrhythmia    PACs - tx with metoprolol  . Esophageal reflux   . Fracture of fibula, distal, right, closed   . History of cold sores   . Infertility, female   . Seasonal allergies     Past Surgical History:  Procedure Laterality Date  . ANKLE FRACTURE SURGERY Bilateral 2005, 2011   left in 2005, right in 2011  . CESAREAN SECTION  1992, 2002   x 2  . CHOLECYSTECTOMY  2002  . COLONOSCOPY    . SHOULDER SURGERY Left 2014   bone spurs  . WISDOM TOOTH EXTRACTION  1988    History reviewed. No pertinent family history.  Social History:  reports that she quit smoking about 17 years ago. Her smoking use included Cigarettes. She has a 25.00 pack-year smoking history. She has never used smokeless tobacco. She reports that she does not drink alcohol or use drugs.  Allergies:  Allergies  Allergen Reactions  . Aspirin Hives  . Latex Dermatitis    Just with ADHESIVES and latex BANDAIDS     Prescriptions Prior to Admission  Medication Sig Dispense Refill Last Dose  . fluticasone furoate-vilanterol (BREO ELLIPTA) 200-25 MCG/INH AEPB Inhale 1 puff into the lungs at bedtime.   08/11/2017 at Unknown time  . ibuprofen (ADVIL,MOTRIN) 200 MG tablet Take 600 mg by mouth at bedtime as needed for mild pain or moderate pain.    Past Week at Unknown time  . levocetirizine (XYZAL) 5 MG tablet Take 5 mg by mouth every  evening.   08/11/2017 at Unknown time  . metoprolol succinate (TOPROL-XL) 100 MG 24 hr tablet Take 100 mg by mouth at bedtime. Take with or immediately following a meal.    08/11/2017 at Unknown time  . pantoprazole (PROTONIX) 40 MG tablet Take 40 mg by mouth at bedtime.    08/11/2017 at Unknown time  . acyclovir (ZOVIRAX) 400 MG tablet Take 400 mg by mouth 2 (two) times daily as needed (COLD SORES).    Unknown at Unknown time  . albuterol (PROVENTIL HFA;VENTOLIN HFA) 108 (90 BASE) MCG/ACT inhaler Inhale into the lungs every 6 (six) hours as needed for wheezing or shortness of breath.   Unknown at Unknown time  . clonazePAM (KLONOPIN) 1 MG tablet Take 1 mg by mouth at bedtime as needed.    More than a month at Unknown time    Review of Systems  Constitutional: Negative.   All other systems reviewed and are negative.   Blood pressure 117/70, pulse 97, temperature 98.4 F (36.9 C), temperature source Oral, resp. rate 16, SpO2 97 %. Physical Exam  Nursing note and vitals reviewed. Constitutional: She is oriented to person, place, and time. She appears well-developed and well-nourished.  HENT:  Head: Normocephalic and atraumatic.  Neck: Normal range of motion. Neck supple.  Cardiovascular: Normal rate and regular rhythm.   Respiratory: Effort normal and breath sounds normal.  GI: Soft. Bowel sounds are  normal.  Genitourinary: Vagina normal and uterus normal.  Musculoskeletal: Normal range of motion.  Neurological: She is alert and oriented to person, place, and time. She has normal reflexes.  Skin: Skin is warm and dry.  Psychiatric: She has a normal mood and affect.    No results found for this or any previous visit (from the past 24 hour(s)).  No results found.  Assessment/Plan: PMB with structural lesion Diag HS with D&C and myosure Consent done  Shakea Isip J 08/12/2017, 8:18 AM

## 2017-08-12 NOTE — Anesthesia Procedure Notes (Signed)
Procedure Name: LMA Insertion Date/Time: 08/12/2017 9:32 AM Performed by: Graciela Husbands Pre-anesthesia Checklist: Patient identified, Patient being monitored, Emergency Drugs available, Timeout performed and Suction available Patient Re-evaluated:Patient Re-evaluated prior to induction Oxygen Delivery Method: Circle System Utilized Preoxygenation: Pre-oxygenation with 100% oxygen Induction Type: IV induction LMA: LMA inserted LMA Size: 4.0 Number of attempts: 1 Placement Confirmation: positive ETCO2 and breath sounds checked- equal and bilateral Tube secured with: Tape Dental Injury: Teeth and Oropharynx as per pre-operative assessment

## 2017-08-12 NOTE — Anesthesia Postprocedure Evaluation (Signed)
Anesthesia Post Note  Patient: Nancy Richards  Procedure(s) Performed: DILATATION & CURETTAGE/HYSTEROSCOPY WITH MYOSURE (N/A )     Patient location during evaluation: PACU Anesthesia Type: General Level of consciousness: awake and alert Pain management: pain level controlled Vital Signs Assessment: post-procedure vital signs reviewed and stable Respiratory status: spontaneous breathing, nonlabored ventilation, respiratory function stable and patient connected to nasal cannula oxygen Cardiovascular status: blood pressure returned to baseline and stable Postop Assessment: no apparent nausea or vomiting Anesthetic complications: no    Last Vitals:  Vitals:   08/12/17 1100 08/12/17 1127  BP: 116/71 123/63  Pulse: 76 77  Resp: 17 17  Temp:  36.9 C  SpO2: 95% 96%    Last Pain:  Vitals:   08/12/17 1100  TempSrc:   PainSc: 2    Pain Goal:                 Phillips Grout

## 2017-08-12 NOTE — Op Note (Signed)
Nancy Richards, Nancy Richards                ACCOUNT NO.:  [id number]  MEDICAL RECORD NO.:  [id number]  LOCATION:  PERIO                         FACILITY:  [facility]  PHYSICIAN:  Lenoard Aden, M.D.DATE OF BIRTH:  1965/12/25  DATE OF PROCEDURE: DATE OF DISCHARGE:                              OPERATIVE REPORT   PREOPERATIVE DIAGNOSIS:  Postmenopausal bleeding with structural lesion on ultrasound.  POSTOPERATIVE DIAGNOSES: 1. Postmenopausal bleeding with structural lesion on ultrasound. 2. Large endometrial polyp.  PROCEDURES: 1. Operative hysteroscopy with MyoSure resection of large endometrial     polyp. 2. D and C.  SURGEON:  Lenoard Aden, M.D.  ASSISTANT:  None.  ANESTHESIA:  Local, general.  ESTIMATED BLOOD LOSS:  50 mL.  FLUID DEFICIT:  450 mL.  COMPLICATIONS:  None.  DRAINS:  None.  COUNTS:  Correct.  DISPOSITION:  The patient to recovery in good condition.  SPECIMEN:  to Pathology.  BRIEF OPERATIVE NOTE:  After being apprised of the risks of anesthesia, infection, bleeding injury to surrounding organs, possible need for repair, delayed versus immediate complications to include bowel and bladder injury, possible need for repair, the patient was brought to the operating room where she was administered general anesthetic without complications.  Prepped and draped in usual sterile fashion.  Feet were placed in the Yellofin stirrups.  Trendelenburg position established. Exam under anesthesia revealed a bulky mid positioned uterus.  Dilute Marcaine solution placed, paracervical block, 20 mL total.  Dilute Pitressin solution placed at 3 and 9 o'clock, 18 mL total.  Cervix was easily dilated up to 25 Pratt dilator.  Hysteroscope placed. Visualization reveals a large endometrial polyp that makes visualization of the endometrial cavity extremely difficult due to the size of the polyp and the polyp occupies and protrudes the internal os.  MyoSure device was then initiated  and resection of this polyp was done in the midsection in the midline, approaching the polyp until decompressing to the level where the cavity can be assessed.  The polyp was removed in total.  Subsequent to removal, a thickened endometrium was noted and bilateral normal tubal ostia are noted.  D and C was performed using sharp curettage in a 4-quadrant method and repeat visualization revealed there by normal endometrial cavity.  Fluid deficit was noted.  Blood loss was noted.  All instruments were removed.  The patient tolerated the procedure well, was transferred to the recovery in good condition.     Lenoard Aden, M.D.     RJT/MEDQ  D:  08/12/2017  T:  08/12/2017  Job:  [id number]  cc:   Kelle Darting, M.D. Fax: [phone]

## 2017-08-12 NOTE — Anesthesia Preprocedure Evaluation (Signed)
Anesthesia Evaluation  Patient identified by MRN, date of birth, ID band Patient awake    Reviewed: Allergy & Precautions, NPO status , Patient's Chart, lab work & pertinent test results  Airway Mallampati: II  TM Distance: >3 FB Neck ROM: Full    Dental no notable dental hx.    Pulmonary asthma , former smoker,    Pulmonary exam normal breath sounds clear to auscultation       Cardiovascular negative cardio ROS Normal cardiovascular exam Rhythm:Regular Rate:Normal     Neuro/Psych negative neurological ROS  negative psych ROS   GI/Hepatic Neg liver ROS, GERD  Medicated and Controlled,  Endo/Other  negative endocrine ROS  Renal/GU negative Renal ROS  negative genitourinary   Musculoskeletal negative musculoskeletal ROS (+)   Abdominal   Peds negative pediatric ROS (+)  Hematology negative hematology ROS (+)   Anesthesia Other Findings   Reproductive/Obstetrics negative OB ROS                             Anesthesia Physical Anesthesia Plan  ASA: II  Anesthesia Plan: General   Post-op Pain Management:    Induction: Intravenous  PONV Risk Score and Plan: 4 or greater and Ondansetron, Dexamethasone, Scopolamine patch - Pre-op, Midazolam and Treatment may vary due to age or medical condition  Airway Management Planned: LMA  Additional Equipment:   Intra-op Plan:   Post-operative Plan:   Informed Consent: I have reviewed the patients History and Physical, chart, labs and discussed the procedure including the risks, benefits and alternatives for the proposed anesthesia with the patient or authorized representative who has indicated his/her understanding and acceptance.   Dental advisory given  Plan Discussed with: CRNA  Anesthesia Plan Comments:         Anesthesia Quick Evaluation

## 2017-08-13 ENCOUNTER — Encounter (HOSPITAL_COMMUNITY): Payer: Self-pay | Admitting: Obstetrics and Gynecology

## 2017-10-18 ENCOUNTER — Ambulatory Visit
Admission: RE | Admit: 2017-10-18 | Discharge: 2017-10-18 | Disposition: A | Discharge status: Home / Self Care | Payer: Managed Care, Other (non HMO) | Source: Ambulatory Visit | Attending: Allergy and Immunology | Admitting: Allergy and Immunology

## 2017-10-18 ENCOUNTER — Other Ambulatory Visit: Payer: Self-pay | Admitting: Allergy and Immunology

## 2017-10-18 DIAGNOSIS — J209 Acute bronchitis, unspecified: Secondary | ICD-10-CM

## 2017-10-19 ENCOUNTER — Emergency Department (HOSPITAL_BASED_OUTPATIENT_CLINIC_OR_DEPARTMENT_OTHER)
Admission: EM | Admit: 2017-10-19 | Discharge: 2017-10-19 | Disposition: A | Discharge status: Home / Self Care | Payer: Managed Care, Other (non HMO) | Attending: Emergency Medicine | Admitting: Emergency Medicine

## 2017-10-19 ENCOUNTER — Other Ambulatory Visit: Payer: Self-pay

## 2017-10-19 ENCOUNTER — Encounter (HOSPITAL_BASED_OUTPATIENT_CLINIC_OR_DEPARTMENT_OTHER): Payer: Self-pay | Admitting: Emergency Medicine

## 2017-10-19 DIAGNOSIS — J45901 Unspecified asthma with (acute) exacerbation: Secondary | ICD-10-CM | POA: Diagnosis not present

## 2017-10-19 DIAGNOSIS — Z87891 Personal history of nicotine dependence: Secondary | ICD-10-CM | POA: Diagnosis not present

## 2017-10-19 DIAGNOSIS — Z79899 Other long term (current) drug therapy: Secondary | ICD-10-CM | POA: Insufficient documentation

## 2017-10-19 DIAGNOSIS — R05 Cough: Secondary | ICD-10-CM | POA: Diagnosis present

## 2017-10-19 DIAGNOSIS — Z91040 Latex allergy status: Secondary | ICD-10-CM | POA: Diagnosis not present

## 2017-10-19 DIAGNOSIS — R0602 Shortness of breath: Secondary | ICD-10-CM | POA: Diagnosis not present

## 2017-10-19 MED ORDER — BENZONATATE 100 MG PO CAPS: 100.0000 mg | ORAL_CAPSULE | Freq: Three times a day (TID) | ORAL | 0 refills | Status: AC

## 2017-10-19 MED ORDER — IPRATROPIUM-ALBUTEROL 0.5-2.5 (3) MG/3ML IN SOLN
3.0000 mL | Freq: Once | RESPIRATORY_TRACT | Status: AC
  Administered 2017-10-19: 3 mL via RESPIRATORY_TRACT
  Filled 2017-10-19: qty 3

## 2017-10-19 MED ORDER — IPRATROPIUM BROMIDE 0.02 % IN SOLN
0.5000 mg | Freq: Once | RESPIRATORY_TRACT | Status: AC
  Administered 2017-10-19: 0.5 mg via RESPIRATORY_TRACT
  Filled 2017-10-19: qty 2.5

## 2017-10-19 MED ORDER — ALBUTEROL SULFATE (2.5 MG/3ML) 0.083% IN NEBU
5.0000 mg | INHALATION_SOLUTION | Freq: Once | RESPIRATORY_TRACT | Status: AC
  Administered 2017-10-19: 5 mg via RESPIRATORY_TRACT
  Filled 2017-10-19: qty 6

## 2017-10-19 NOTE — ED Triage Notes (Addendum)
Patient states that she has had cough x 2 -3 days - patient reports that she has tried some inhaler at home and treatment. Patient is horse and coughing continuously. Patient went to [facility] on Thursday and started on Levaquin on Friday. Patient was on a zpack prior to that

## 2017-10-19 NOTE — ED Notes (Addendum)
AMBULATING: SAO2 93%-96% RA , HR 102-118   RT aware.

## 2017-10-19 NOTE — ED Notes (Signed)
PA at bedside.

## 2017-10-19 NOTE — Discharge Instructions (Signed)
Please continue to take the Levaquin and Prednisone that you have been prescribed until they are finished. Continue to take your Breo and Albuterol inhalers as instructed and repeat albuterol nebulizer treatments every four hours as needed for the next 5-7 days. Return to the ER if you experience and new, worsening, or persistent symptoms.

## 2017-10-19 NOTE — ED Provider Notes (Signed)
[facility] EMERGENCY DEPARTMENT Provider Note   CSN: [id number] Arrival date & [id number]: 10/19/17  1330     History   Chief Complaint Chief Complaint  Patient presents with  . Cough    HPI Nancy Richards is a 51 y.o. female.  HPI   51 y/o Pt with a h/o of asthma, allergies, and GERD presents to the ED c/o a 2 week h/o cough, SOB, wheezing that has been persistent in nature. Pt was recently seen at urgent care and was started on a Z-pack and a 10 day steroid taper. She took 3 doses of her z-pack and was switched to Levaquin by her allergist 1 day ago. She has now had 2 doses of Levaquin and 5 doses of prednisone. She also received a steroid shot at her allergist yesterday and was started on Breo and Xyzal. She had CXR completed that was read as negative.  Pt reports increased use of her inhaler over the last two week and has been using her albuterol inhaler about every hour for the last 3 days. She has been doing albuterol nebulizer treatments 3x/day for the last 3 days.   She reports mild nasal congestion and pressure to her bilateral ears, but denies any fevers, sore throat, changes in appetite or PO intake, abd pain, NVD, CP. Pt is a former smoker.  Past Medical History:  Diagnosis Date  . Allergic rhinitis   . Anxiety   . Asthma   . Dysrhythmia    PACs - tx with metoprolol  . Esophageal reflux   . Fracture of fibula, distal, right, closed   . History of cold sores   . Infertility, female   . Seasonal allergies     There are no active problems to display for this patient.   Past Surgical History:  Procedure Laterality Date  . ANKLE FRACTURE SURGERY Bilateral 2005, 2011   left in 2005, right in 2011  . CESAREAN SECTION  1992, 2002   x 2  . CHOLECYSTECTOMY  2002  . COLONOSCOPY    . DILATATION & CURETTAGE/HYSTEROSCOPY WITH MYOSURE N/A 08/12/2017   Procedure: DILATATION & CURETTAGE/HYSTEROSCOPY WITH MYOSURE;  Surgeon: Olivia Mackieaavon, Richard, MD;  Location: [facility] ORS;   Service: Gynecology;  Laterality: N/A;  . SHOULDER SURGERY Left 2014   bone spurs  . WISDOM TOOTH EXTRACTION  1988    OB History    No data available       Home Medications    Prior to Admission medications   Medication Sig Start Date End Date Taking? Authorizing Provider  acyclovir (ZOVIRAX) 400 MG tablet Take 400 mg by mouth 2 (two) times daily as needed (COLD SORES).     [provider]  albuterol (PROVENTIL HFA;VENTOLIN HFA) 108 (90 BASE) MCG/ACT inhaler Inhale into the lungs every 6 (six) hours as needed for wheezing or shortness of breath.    [provider]  benzonatate (TESSALON) 100 MG capsule Take 1 capsule (100 mg total) by mouth every 8 (eight) hours for 7 days. 10/19/17 10/26/17  Chayanne Filippi S, PA-C  clonazePAM (KLONOPIN) 1 MG tablet Take 1 mg by mouth at bedtime as needed.     [provider]  fluticasone furoate-vilanterol (BREO ELLIPTA) 200-25 MCG/INH AEPB Inhale 1 puff into the lungs at bedtime.    [provider]  ibuprofen (ADVIL,MOTRIN) 200 MG tablet Take 600 mg by mouth at bedtime as needed for mild pain or moderate pain.     [provider]  levocetirizine (  XYZAL) 5 MG tablet Take 5 mg by mouth every evening.    [provider]  metoprolol succinate (TOPROL-XL) 100 MG 24 hr tablet Take 100 mg by mouth at bedtime. Take with or immediately following a meal.     [provider]  pantoprazole (PROTONIX) 40 MG tablet Take 40 mg by mouth at bedtime.     [provider]  traMADol (ULTRAM) 50 MG tablet Take 1-2 tablets (50-100 mg total) by mouth every 6 (six) hours as needed. 08/12/17   Olivia Mackie, MD    Family History History reviewed. No pertinent family history.  Social History Social History   Tobacco Use  . Smoking status: Former Smoker    Packs/day: 1.00    Years: 25.00    Pack years: 25.00    Types: Cigarettes    Last attempt to quit: 11/13/1999    Years since quitting: 17.9  .  Smokeless tobacco: Never Used  Substance Use Topics  . Alcohol use: No  . Drug use: No     Allergies   Aspirin and Latex   Review of Systems Review of Systems  Constitutional: Negative for chills, fatigue and fever.  HENT: Positive for congestion, sore throat, tinnitus and voice change. Negative for ear discharge, ear pain and sinus pressure.   Eyes: Negative.   Respiratory: Positive for cough, chest tightness, shortness of breath and wheezing.   Cardiovascular: Negative for chest pain.  Gastrointestinal: Negative for abdominal pain, constipation, diarrhea, nausea and vomiting.  Genitourinary: Negative.   Musculoskeletal: Positive for back pain (lower, secondary to coughing).  Neurological: Negative.   Hematological: Negative for adenopathy.     Physical Exam Updated Vital Signs BP (!) 159/90 (BP Location: Right Arm)   Pulse 84   Temp 98 F (36.7 C) (Oral)   Resp 20   Ht 5\' 4"  (1.626 m)   Wt 90.7 kg (200 lb)   SpO2 98%   BMI 34.33 kg/m   Physical Exam  Constitutional: She appears well-developed and well-nourished. No distress.  HENT:  Head: Normocephalic and atraumatic.  Right Ear: External ear normal.  Left Ear: External ear normal.  Nose: Nose normal.  Mouth/Throat: Oropharynx is clear and moist. No oropharyngeal exudate.  No pharyngeal erythema. Bilateral TMs are intact, but are bulging. No fluid behind the TMs bilaterally. There is no erythema.  Eyes: Conjunctivae and EOM are normal. Pupils are equal, round, and reactive to light.  Pulmonary/Chest: Effort normal. No respiratory distress.  Coarse breath sounds on inspiration that is worse in the bilateral lower bases. Mild expiratory wheezing. No tachypnea. Pt is able to speak in full sentences. She is audibly coughing on exam.   Abdominal: Soft. Bowel sounds are normal. She exhibits no distension. There is no tenderness.     ED Treatments / Results  Labs (all labs ordered are listed, but only abnormal  results are displayed) Labs Reviewed - No data to display  EKG  EKG Interpretation None       Radiology Dg Chest 2 View  Result Date: 10/18/2017 CLINICAL DATA:  Bronchitis.  Productive cough.  Shortness of breath. EXAM: CHEST  2 VIEW COMPARISON:  Chest x-ray 12/09/2014. FINDINGS: Mediastinum and hilar structures normal. Heart size stable. No focal infiltrate. No pleural effusion or pneumothorax. No acute bony abnormality. Surgical clips right upper quadrant. IMPRESSION: No acute cardiopulmonary disease. Electronically Signed   By: Maisie Fus  Register   On: 10/18/2017 13:31    Procedures Procedures (including critical care time)  Medications Ordered  in ED Medications  ipratropium-albuterol (DUONEB) 0.5-2.5 (3) MG/3ML nebulizer solution 3 mL (3 mLs Nebulization Given 10/19/17 1343)  albuterol (PROVENTIL) (2.5 MG/3ML) 0.083% nebulizer solution 5 mg (5 mg Nebulization Given 10/19/17 1659)  ipratropium (ATROVENT) nebulizer solution 0.5 mg (0.5 mg Nebulization Given 10/19/17 1659)     Initial Impression / Assessment and Plan / ED Course  I have reviewed the triage vital signs and the nursing notes.  Pertinent labs & imaging results that were available during my care of the patient were reviewed by me and considered in my medical decision making (see chart for details).  1640 Evaluated patient in the ED. Discussed plan for ED course and input orders.   1703 Rechecked pt. Respiratory therapist is in room to complete nebulizer tx. Pt has no new complaints at this time.   1708. Reviewed nursing note. Pt ambulated and sats were in the range of 93-96% during ambulation. HR 102 118.  1803 Rechecked patient. She states that she feels somewhat tremulous, but that her breathing has improved after the neb treatment. Lungs continue to sound course bilaterally, but there is adequate movement of air throughout.   1832 Geiple, PA in to see patient.  1845 Rechecked pt. She is feeling improved.  Discussed the plan for discharge and advised the patient to continue her current medications as prescribed. She can take Tessalon Perles for cough as needed and use albuterol nebulizer treatments every 4 hours as needed. Further advised patient to return to the ER if she experiences any new, worsening, or persistent symptoms. She should follow up with her PCP in 7-10 days. She voiced an understanding of the plan and is in agreement. All questions answered.  Final Clinical Impressions(s) / ED Diagnoses   Final diagnoses:  Asthma with acute exacerbation, unspecified asthma severity, unspecified whether persistent   Pt presenting with cough and SOB that has been ongoing for two weeks. Her history, sxs, and PEx are most consistent with an acute asthma exacerbation. She was recently seen by her allergist and started on a 5 day course of Levaquin and a steroid taper. She was also started on Breo and Xyzal as well at that time. Her sats in the ED have been stable and >93% throughout her visit. She has not appeared to be in any respiratory distress and has showed much improvement after receiving a nebulizer treatment. I have low suspicion for pneumonia given the negative CXR on 12/7, but will advise her to continue the course of Levaquin that she is currently on. Have advised her to also continue steroid taper, take albuterol nebulizer treatments q4 hours as needed, and take Benzonatate for cough if needed.   ED Discharge Orders        Ordered    benzonatate (TESSALON) 100 MG capsule  Every 8 hours     10/19/17 1836       Karrie MeresCouture, Fiana Gladu S, PA-C 10/20/17 0117    Tilden Fossaees, Elizabeth, MD 10/20/17 [phone]

## 2017-10-19 NOTE — ED Provider Notes (Signed)
6:57 PM Patient seen in conjunction with Couture PA-C.  Patient with 2 weeks of cough in setting of asthma.  She has seen her both her primary care physician and allergist.  She was started on azithromycin and then switched to Levaquin which she continues to take.  She had a chest x-ray yesterday which was clear.  She was scared today during a episode of coughing where it felt like her throat and chest was closing up.  After treatment in the emergency department with albuterol and Atrovent, she is feeling better.  She has Tessalon at home for cough and will continue this.  She also has albuterol nebulizer treatments.  I have encouraged her to use this every 4 hours for the next 24 hours.  She has ambulated without desaturation.  Feel like she can be discharged home at this time.  Provided with an additional RX for Tessalon.  BP (!) 159/90 (BP Location: Right Arm)   Pulse 84   Temp 98 F (36.7 C) (Oral)   Resp 20   Ht 5\' 4"  (1.626 m)   Wt 90.7 kg (200 lb)   SpO2 98%   BMI 34.33 kg/m     Renne CriglerGeiple, Mikeal Winstanley, PA-C 10/19/17 1932    Tilden Fossaees, Elizabeth, MD 10/20/17 [phone]

## 2017-12-10 ENCOUNTER — Encounter (HOSPITAL_COMMUNITY): Payer: Self-pay | Admitting: Emergency Medicine

## 2017-12-10 ENCOUNTER — Other Ambulatory Visit: Payer: Self-pay

## 2017-12-10 ENCOUNTER — Emergency Department (HOSPITAL_COMMUNITY)
Admission: EM | Admit: 2017-12-10 | Discharge: 2017-12-10 | Discharge status: Against Medical Advice | Payer: Managed Care, Other (non HMO) | Attending: Emergency Medicine | Admitting: Emergency Medicine

## 2017-12-10 ENCOUNTER — Emergency Department (HOSPITAL_COMMUNITY): Payer: Managed Care, Other (non HMO)

## 2017-12-10 DIAGNOSIS — R0602 Shortness of breath: Secondary | ICD-10-CM | POA: Insufficient documentation

## 2017-12-10 DIAGNOSIS — Z5321 Procedure and treatment not carried out due to patient leaving prior to being seen by health care provider: Secondary | ICD-10-CM | POA: Diagnosis not present

## 2017-12-10 NOTE — ED Notes (Signed)
Pt informed this NT that she was leaving to go see her doctor. Pt encouraged to stay. Pt LWBS.

## 2017-12-10 NOTE — ED Triage Notes (Signed)
Pt c/o shortness of breath and dry cough since Friday. Pt recently diagnosed with pneumonia, started on prednisone and antibiotics. Reports having to use inhalers and home neb treatments regularly with no relief.

## 2017-12-23 ENCOUNTER — Encounter: Payer: Self-pay | Admitting: Pulmonary Disease

## 2017-12-23 ENCOUNTER — Ambulatory Visit (INDEPENDENT_AMBULATORY_CARE_PROVIDER_SITE_OTHER): Payer: Managed Care, Other (non HMO) | Admitting: Pulmonary Disease

## 2017-12-23 VITALS — BP 118/74 | HR 72 | Ht 64.0 in | Wt 214.4 lb

## 2017-12-23 DIAGNOSIS — R053 Chronic cough: Secondary | ICD-10-CM | POA: Insufficient documentation

## 2017-12-23 DIAGNOSIS — J45909 Unspecified asthma, uncomplicated: Secondary | ICD-10-CM

## 2017-12-23 DIAGNOSIS — J4541 Moderate persistent asthma with (acute) exacerbation: Secondary | ICD-10-CM

## 2017-12-23 DIAGNOSIS — R05 Cough: Secondary | ICD-10-CM | POA: Diagnosis not present

## 2017-12-23 NOTE — Progress Notes (Signed)
Subjective:    Patient ID: Nancy Richards, female    DOB: Apr 29, 1966, 52 y.o.   MRN: [id number]  HPI  Chief Complaint  Patient presents with  . Pulm Consult    Referred by Terri Piedra for possible asthma and PNA. Has been coughing since Nov. 2018. Productive cough.     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 As well diagnosed in 2014, she was also diagnosed with multiple allergies around the same time to cat, tree and grass.  Triggers include dust and orders including perfumes.  She was initially maintained on Asmanex and Allegra for perennial allergies, thi s was changed to Xyzal She has been maintained on Brio now for the past year. Around Thanksgiving she developed Cold-like symptoms .  This improved within a couple of weeks but the cough persisted.  She developed flulike symptoms in January, she had 2 visits to urgent care and was told that she had pneumonia, initially given Doxy with prednisone but when symptoms persisted, this was changed to Levaquin with 40 mg of prednisone. I note chest x-ray on 1/29 done in the emergency room which shows no infiltrates, also previous chest x-ray from 12/7 was clear.  She now reports persistent cough, productive of mostly clear white mucus but occasionally yellow.  She reports intermittent wheezing.  She does not like to use her rescue albuterol much and has hardly needed her nebulizer medications.  She smoked about a pack per day for 15 years before she quit in 2001.  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 Allergic rhinitis   . Anxiety   . Asthma   . Dysrhythmia    PACs - tx with metoprolol  . Esophageal reflux   . Fracture of fibula, distal, right, closed   . History of cold sores   . Infertility, female   . Seasonal allergies    Past  Surgical History:  Procedure Laterality Date  . ANKLE FRACTURE SURGERY Bilateral 2005, 2011   left in 2005, right in 2011  . CESAREAN SECTION  1992, 2002   x 2  . CHOLECYSTECTOMY  2002  . COLONOSCOPY    . DILATATION & CURETTAGE/HYSTEROSCOPY WITH MYOSURE N/A 08/12/2017   Procedure: DILATATION & CURETTAGE/HYSTEROSCOPY WITH MYOSURE;  Surgeon: Olivia Mackie, MD;  Location: [facility] OR[facility];  Service: Gynecology;  Laterality: N/A;  . SHOULDER SURGERY Left 2014   bone spurs  . WISDOM TOOTH EXTRACTION  1988    Allergies  Allergen Reactions  . Aspirin Hives  . Latex Dermatitis    Just with ADHESIVES and latex BANDAIDS      Social History   Socioeconomic History  . Marital status: Married    Spouse name: Not on file  . Number of children: Not on file  . Years of education: Not on file  . Highest education level: Not on file  Social Needs  . Financial resource strain: Not on file  . Food insecurity - worry: Not on file  . Food insecurity - inability: Not on file  . Transportation needs - medical: Not on file  . Transportation needs - non-medical: Not on file  Occupational History  . Not on file  Tobacco Use  . Smoking status: Former Smoker    Packs/day: 1.00    Years:  25.00    Pack years: 25.00    Types: Cigarettes    Last attempt to quit: 11/13/1999    Years since quitting: 18.1  . Smokeless tobacco: Never Used  Substance and Sexual Activity  . Alcohol use: No  . Drug use: No  . Sexual activity: Yes    Birth control/protection: Post-menopausal  Other Topics Concern  . Not on file  Social History Narrative  . Not on file     No family history on file. Mother died of emphysema  Review of Systems Positive for shortness of breath with activity, cough productive of clear sputum as well, indigestion, itching, headaches, nasal congestion, feet swelling which is resolved  Constitutional: negative for anorexia, fevers and sweats  Eyes: negative for irritation, redness and  visual disturbance  Ears, nose, mouth, throat, and face: negative for earaches, epistaxis, nasal congestion and sore throat  Respiratory: negative for cough, dyspnea on exertion, sputum and wheezing  Cardiovascular: negative for chest pain, dyspnea, lower extremity edema, orthopnea, palpitations and syncope  Gastrointestinal: negative for abdominal pain, constipation, diarrhea, melena, nausea and vomiting  Genitourinary:negative for dysuria, frequency and hematuria  Hematologic/lymphatic: negative for bleeding, easy bruising and lymphadenopathy  Musculoskeletal:negative for arthralgias, muscle weakness and stiff joints  Neurological: negative for coordination problems, gait problems, headaches and weakness  Endocrine: negative for diabetic symptoms including polydipsia, polyuria and weight loss     Objective:   Physical Exam  Gen. Pleasant, well-nourished, in no distress, normal affect ENT - no lesions, no post nasal drip Neck: No JVD, no thyromegaly, no carotid bruits Lungs: no use of accessory muscles, no dullness to percussion,coarse without rales or rhonchi  Cardiovascular: Rhythm regular, heart sounds  normal, no murmurs or gallops, no peripheral edema Abdomen: soft and non-tender, no hepatosplenomegaly, BS normal. Musculoskeletal: No deformities, no cyanosis or clubbing Neuro:  alert, non focal        Assessment & Plan:

## 2017-12-23 NOTE — Assessment & Plan Note (Signed)
Asthma appears controlled currently Continue on Brio once daily -if you stay well, we will consider stepping down to Asmanex.  Stay on Singulair at bedtime. Antiallergy medicine-Allegra or Xyzal

## 2017-12-23 NOTE — Assessment & Plan Note (Signed)
Cough variant asthma versus GERD  Continue Protonix for now

## 2017-12-23 NOTE — Patient Instructions (Signed)
Asthma appears controlled currently Continue on Brio once daily -if you stay well, we will consider stepping down to Asmanex.  Stay on Singulair at bedtime. Antiallergy medicine-Allegra or Xyzal 

## 2020-06-14 MED FILL — ERYTHROMYCIN EYE OINTMENT: 5 | 7 days supply | Qty: 4 | Fill #0

## 2020-10-17 DIAGNOSIS — L600 Ingrowing nail: Secondary | ICD-10-CM | POA: Diagnosis not present

## 2020-10-20 DIAGNOSIS — L03032 Cellulitis of left toe: Secondary | ICD-10-CM | POA: Diagnosis not present

## 2020-11-02 DIAGNOSIS — L03032 Cellulitis of left toe: Secondary | ICD-10-CM | POA: Diagnosis not present

## 2020-11-09 ENCOUNTER — Other Ambulatory Visit (HOSPITAL_BASED_OUTPATIENT_CLINIC_OR_DEPARTMENT_OTHER): Payer: Self-pay | Admitting: Nurse Practitioner

## 2020-11-09 MED FILL — BREO ELLIPTA 200-25 MCG INH: 200-25 | 30 days supply | Qty: 60 | Fill #0

## 2020-11-17 DIAGNOSIS — B353 Tinea pedis: Secondary | ICD-10-CM | POA: Diagnosis not present

## 2020-11-17 DIAGNOSIS — L03032 Cellulitis of left toe: Secondary | ICD-10-CM | POA: Diagnosis not present

## 2020-11-17 DIAGNOSIS — B351 Tinea unguium: Secondary | ICD-10-CM | POA: Diagnosis not present

## 2020-11-22 DIAGNOSIS — Z03818 Encounter for observation for suspected exposure to other biological agents ruled out: Secondary | ICD-10-CM | POA: Diagnosis not present

## 2020-12-21 MED FILL — BREO ELLIPTA 200-25 MCG INH: 200-25 | 30 days supply | Qty: 60 | Fill #1

## 2021-01-12 MED FILL — BREO ELLIPTA 200-25 MCG INH: 200-25 | 30 days supply | Qty: 60 | Fill #2

## 2021-01-14 DIAGNOSIS — Z03818 Encounter for observation for suspected exposure to other biological agents ruled out: Secondary | ICD-10-CM | POA: Diagnosis not present

## 2021-01-14 DIAGNOSIS — Z20822 Contact with and (suspected) exposure to covid-19: Secondary | ICD-10-CM | POA: Diagnosis not present

## 2021-01-26 ENCOUNTER — Other Ambulatory Visit (HOSPITAL_BASED_OUTPATIENT_CLINIC_OR_DEPARTMENT_OTHER): Payer: Self-pay | Admitting: Nurse Practitioner

## 2021-01-26 DIAGNOSIS — K219 Gastro-esophageal reflux disease without esophagitis: Secondary | ICD-10-CM | POA: Diagnosis not present

## 2021-01-26 DIAGNOSIS — M25571 Pain in right ankle and joints of right foot: Secondary | ICD-10-CM | POA: Diagnosis not present

## 2021-02-02 ENCOUNTER — Other Ambulatory Visit (HOSPITAL_COMMUNITY): Payer: Self-pay | Admitting: Pharmacist

## 2021-02-02 MED FILL — CARESTART COVID-19 HOME TES: 4 days supply | Qty: 4 | Fill #0

## 2021-02-03 ENCOUNTER — Other Ambulatory Visit (HOSPITAL_BASED_OUTPATIENT_CLINIC_OR_DEPARTMENT_OTHER): Payer: Self-pay | Admitting: Gastroenterology

## 2021-02-03 DIAGNOSIS — R1013 Epigastric pain: Secondary | ICD-10-CM | POA: Diagnosis not present

## 2021-02-03 DIAGNOSIS — K649 Unspecified hemorrhoids: Secondary | ICD-10-CM | POA: Diagnosis not present

## 2021-02-03 DIAGNOSIS — K5900 Constipation, unspecified: Secondary | ICD-10-CM | POA: Diagnosis not present

## 2021-02-03 DIAGNOSIS — A048 Other specified bacterial intestinal infections: Secondary | ICD-10-CM | POA: Diagnosis not present

## 2021-02-03 MED FILL — SUCRALFATE 1 GM TABLET: 1 | 30 days supply | Qty: 60 | Fill #0

## 2021-02-03 MED FILL — PROCTOZONE-HC 2.5 % CREA: 2.5 | 30 days supply | Qty: 30 | Fill #0

## 2021-02-15 ENCOUNTER — Other Ambulatory Visit (HOSPITAL_BASED_OUTPATIENT_CLINIC_OR_DEPARTMENT_OTHER): Payer: Self-pay

## 2021-02-15 MED FILL — Fluticasone Furoate-Vilanterol Aero Powd BA 200-25 MCG/ACT: RESPIRATORY_TRACT | 30 days supply | Qty: 60 | Fill #0 | Status: CN

## 2021-02-23 ENCOUNTER — Other Ambulatory Visit (HOSPITAL_BASED_OUTPATIENT_CLINIC_OR_DEPARTMENT_OTHER): Payer: Self-pay

## 2021-02-23 MED FILL — Fluticasone Furoate-Vilanterol Aero Powd BA 200-25 MCG/ACT: RESPIRATORY_TRACT | 30 days supply | Qty: 60 | Fill #0 | Status: AC

## 2021-03-06 ENCOUNTER — Other Ambulatory Visit: Payer: Self-pay | Admitting: Gastroenterology

## 2021-03-06 ENCOUNTER — Other Ambulatory Visit (HOSPITAL_BASED_OUTPATIENT_CLINIC_OR_DEPARTMENT_OTHER): Payer: Self-pay

## 2021-03-06 DIAGNOSIS — K649 Unspecified hemorrhoids: Secondary | ICD-10-CM | POA: Diagnosis not present

## 2021-03-06 DIAGNOSIS — R1013 Epigastric pain: Secondary | ICD-10-CM | POA: Diagnosis not present

## 2021-03-06 DIAGNOSIS — K5900 Constipation, unspecified: Secondary | ICD-10-CM | POA: Diagnosis not present

## 2021-03-06 DIAGNOSIS — K219 Gastro-esophageal reflux disease without esophagitis: Secondary | ICD-10-CM

## 2021-03-06 MED ORDER — PANTOPRAZOLE SODIUM 40 MG PO TBEC
DELAYED_RELEASE_TABLET | ORAL | 2 refills | Status: DC
  Filled 2021-03-06 – 2021-05-01 (×3): qty 90, 90d supply, fill #0

## 2021-03-07 ENCOUNTER — Other Ambulatory Visit (HOSPITAL_BASED_OUTPATIENT_CLINIC_OR_DEPARTMENT_OTHER): Payer: Self-pay

## 2021-03-14 ENCOUNTER — Other Ambulatory Visit (HOSPITAL_BASED_OUTPATIENT_CLINIC_OR_DEPARTMENT_OTHER): Payer: Self-pay

## 2021-03-27 ENCOUNTER — Other Ambulatory Visit (HOSPITAL_BASED_OUTPATIENT_CLINIC_OR_DEPARTMENT_OTHER): Payer: Self-pay

## 2021-03-27 ENCOUNTER — Ambulatory Visit
Admission: RE | Admit: 2021-03-27 | Discharge: 2021-03-27 | Disposition: A | Discharge status: Home / Self Care | Payer: Managed Care, Other (non HMO) | Source: Ambulatory Visit | Attending: Gastroenterology | Admitting: Gastroenterology

## 2021-03-27 DIAGNOSIS — R1013 Epigastric pain: Secondary | ICD-10-CM

## 2021-03-27 DIAGNOSIS — K219 Gastro-esophageal reflux disease without esophagitis: Secondary | ICD-10-CM | POA: Diagnosis not present

## 2021-03-27 MED FILL — Fluticasone Furoate-Vilanterol Aero Powd BA 200-25 MCG/ACT: RESPIRATORY_TRACT | 30 days supply | Qty: 60 | Fill #1 | Status: AC

## 2021-03-27 MED FILL — Fluticasone Furoate-Vilanterol Aero Powd BA 200-25 MCG/ACT: RESPIRATORY_TRACT | 60 days supply | Qty: 60 | Fill #1 | Status: CN

## 2021-03-31 ENCOUNTER — Other Ambulatory Visit (HOSPITAL_BASED_OUTPATIENT_CLINIC_OR_DEPARTMENT_OTHER): Payer: Self-pay

## 2021-03-31 DIAGNOSIS — M79671 Pain in right foot: Secondary | ICD-10-CM | POA: Diagnosis not present

## 2021-03-31 DIAGNOSIS — T8484XA Pain due to internal orthopedic prosthetic devices, implants and grafts, initial encounter: Secondary | ICD-10-CM | POA: Diagnosis not present

## 2021-03-31 DIAGNOSIS — M25571 Pain in right ankle and joints of right foot: Secondary | ICD-10-CM | POA: Diagnosis not present

## 2021-03-31 MED ORDER — DICLOFENAC SODIUM 1 % EX GEL
CUTANEOUS | 1 refills | Status: DC
  Filled 2021-03-31: qty 100, 13d supply, fill #0

## 2021-04-06 DIAGNOSIS — Z124 Encounter for screening for malignant neoplasm of cervix: Secondary | ICD-10-CM | POA: Diagnosis not present

## 2021-04-06 DIAGNOSIS — N959 Unspecified menopausal and perimenopausal disorder: Secondary | ICD-10-CM | POA: Diagnosis not present

## 2021-04-06 DIAGNOSIS — Z01419 Encounter for gynecological examination (general) (routine) without abnormal findings: Secondary | ICD-10-CM | POA: Diagnosis not present

## 2021-04-06 DIAGNOSIS — Z113 Encounter for screening for infections with a predominantly sexual mode of transmission: Secondary | ICD-10-CM | POA: Diagnosis not present

## 2021-04-06 DIAGNOSIS — Z6830 Body mass index (BMI) 30.0-30.9, adult: Secondary | ICD-10-CM | POA: Diagnosis not present

## 2021-04-06 DIAGNOSIS — Z1231 Encounter for screening mammogram for malignant neoplasm of breast: Secondary | ICD-10-CM | POA: Diagnosis not present

## 2021-04-06 DIAGNOSIS — Z01411 Encounter for gynecological examination (general) (routine) with abnormal findings: Secondary | ICD-10-CM | POA: Diagnosis not present

## 2021-04-07 ENCOUNTER — Other Ambulatory Visit (HOSPITAL_BASED_OUTPATIENT_CLINIC_OR_DEPARTMENT_OTHER): Payer: Self-pay

## 2021-04-21 ENCOUNTER — Other Ambulatory Visit (HOSPITAL_BASED_OUTPATIENT_CLINIC_OR_DEPARTMENT_OTHER): Payer: Self-pay

## 2021-04-21 MED FILL — Fluticasone Furoate-Vilanterol Aero Powd BA 200-25 MCG/ACT: RESPIRATORY_TRACT | 30 days supply | Qty: 60 | Fill #2 | Status: AC

## 2021-04-24 ENCOUNTER — Other Ambulatory Visit (HOSPITAL_BASED_OUTPATIENT_CLINIC_OR_DEPARTMENT_OTHER): Payer: Self-pay

## 2021-04-25 ENCOUNTER — Other Ambulatory Visit (HOSPITAL_BASED_OUTPATIENT_CLINIC_OR_DEPARTMENT_OTHER): Payer: Self-pay

## 2021-05-01 ENCOUNTER — Other Ambulatory Visit (HOSPITAL_BASED_OUTPATIENT_CLINIC_OR_DEPARTMENT_OTHER): Payer: Self-pay

## 2021-05-10 ENCOUNTER — Other Ambulatory Visit (HOSPITAL_BASED_OUTPATIENT_CLINIC_OR_DEPARTMENT_OTHER): Payer: Self-pay

## 2021-05-10 DIAGNOSIS — K219 Gastro-esophageal reflux disease without esophagitis: Secondary | ICD-10-CM | POA: Diagnosis not present

## 2021-05-10 DIAGNOSIS — R1013 Epigastric pain: Secondary | ICD-10-CM | POA: Diagnosis not present

## 2021-05-10 DIAGNOSIS — R1310 Dysphagia, unspecified: Secondary | ICD-10-CM | POA: Diagnosis not present

## 2021-05-10 DIAGNOSIS — F419 Anxiety disorder, unspecified: Secondary | ICD-10-CM | POA: Diagnosis not present

## 2021-05-10 MED ORDER — HYOSCYAMINE SULFATE 0.125 MG PO TBDP
ORAL_TABLET | ORAL | 1 refills | Status: DC
  Filled 2021-05-10: qty 120, 30d supply, fill #0

## 2021-05-11 ENCOUNTER — Other Ambulatory Visit (HOSPITAL_BASED_OUTPATIENT_CLINIC_OR_DEPARTMENT_OTHER): Payer: Self-pay

## 2021-05-18 ENCOUNTER — Other Ambulatory Visit (HOSPITAL_BASED_OUTPATIENT_CLINIC_OR_DEPARTMENT_OTHER): Payer: Self-pay

## 2021-05-18 DIAGNOSIS — Z131 Encounter for screening for diabetes mellitus: Secondary | ICD-10-CM | POA: Diagnosis not present

## 2021-05-18 DIAGNOSIS — Z13220 Encounter for screening for lipoid disorders: Secondary | ICD-10-CM | POA: Diagnosis not present

## 2021-05-18 DIAGNOSIS — J309 Allergic rhinitis, unspecified: Secondary | ICD-10-CM | POA: Diagnosis not present

## 2021-05-18 DIAGNOSIS — Z0189 Encounter for other specified special examinations: Secondary | ICD-10-CM | POA: Diagnosis not present

## 2021-05-18 MED ORDER — FLUTICASONE FUROATE-VILANTEROL 200-25 MCG/INH IN AEPB
INHALATION_SPRAY | RESPIRATORY_TRACT | 0 refills | Status: DC
  Filled 2021-05-18 – 2021-06-22 (×3): qty 180, 90d supply, fill #0

## 2021-05-19 ENCOUNTER — Other Ambulatory Visit (HOSPITAL_BASED_OUTPATIENT_CLINIC_OR_DEPARTMENT_OTHER): Payer: Self-pay

## 2021-05-24 ENCOUNTER — Other Ambulatory Visit (HOSPITAL_BASED_OUTPATIENT_CLINIC_OR_DEPARTMENT_OTHER): Payer: Self-pay

## 2021-06-05 ENCOUNTER — Other Ambulatory Visit (HOSPITAL_BASED_OUTPATIENT_CLINIC_OR_DEPARTMENT_OTHER): Payer: Self-pay

## 2021-06-07 ENCOUNTER — Other Ambulatory Visit (HOSPITAL_BASED_OUTPATIENT_CLINIC_OR_DEPARTMENT_OTHER): Payer: Self-pay

## 2021-06-12 ENCOUNTER — Other Ambulatory Visit (HOSPITAL_BASED_OUTPATIENT_CLINIC_OR_DEPARTMENT_OTHER): Payer: Self-pay

## 2021-06-22 ENCOUNTER — Other Ambulatory Visit (HOSPITAL_BASED_OUTPATIENT_CLINIC_OR_DEPARTMENT_OTHER): Payer: Self-pay

## 2021-08-18 ENCOUNTER — Other Ambulatory Visit: Payer: Self-pay

## 2021-09-03 IMAGING — RF DG UGI W/ HIGH DENSITY W/O KUB
1 series · 15 of 24 positions shown · non-contrast
Comparison: None.

CLINICAL DATA: Epigastric pain, gastroesophageal reflux.

EXAM:
UPPER GI SERIES WITH KUB
TECHNIQUE: After obtaining a scout radiograph a routine upper GI series was
performed using thin and high density barium.
FLUOROSCOPY TIME:  Fluoroscopy Time:  1 minutes 36 seconds
Radiation Exposure Index (if provided by the fluoroscopic device):
21 mGy
Number of Acquired Spot Images: 5

[Series 1: one shot · 0.14mm/px · 15 of 38 slices shown]
[im 1/38]
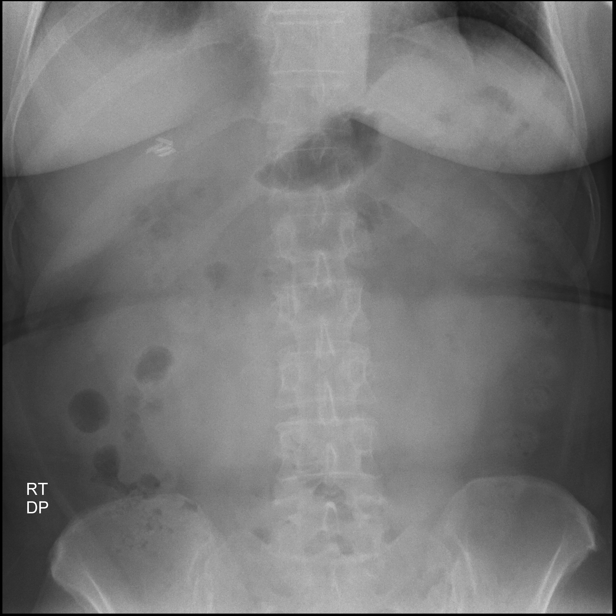
[im 4/38]
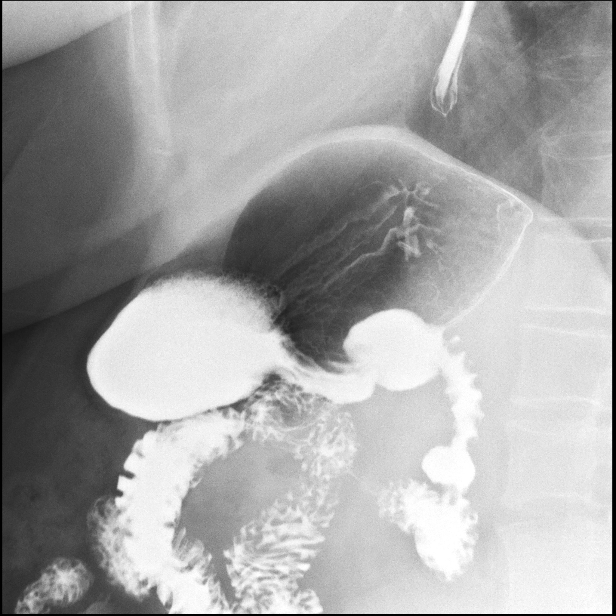
[im 7/38]
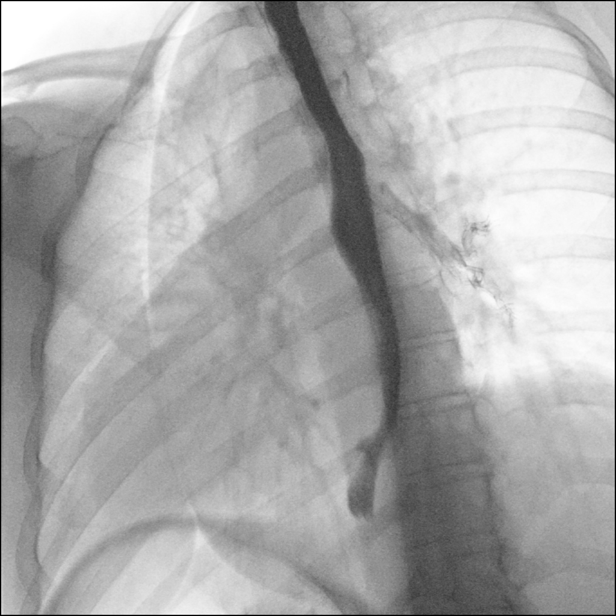
[im 9/38]
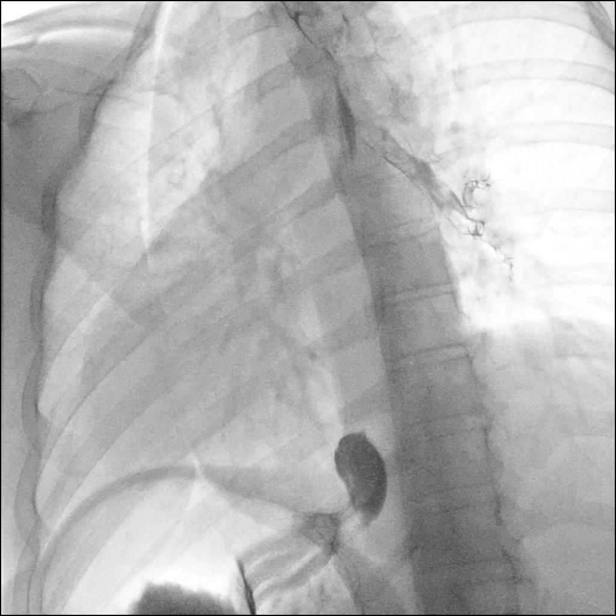
[im 12/38]
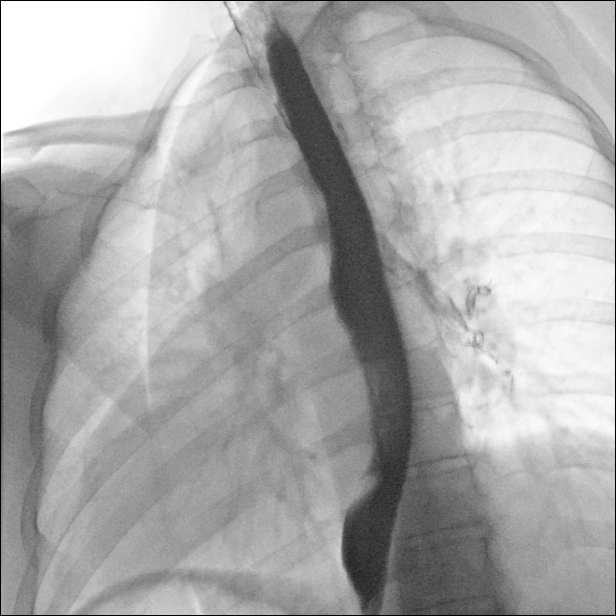
[im 13/38]
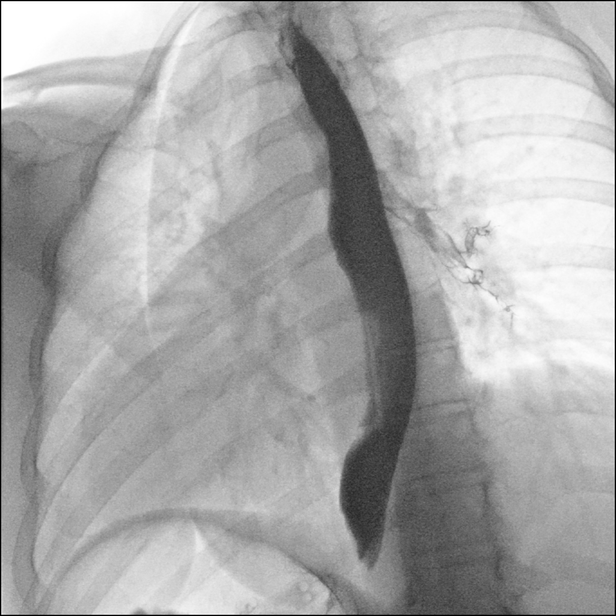
[im 17/38]
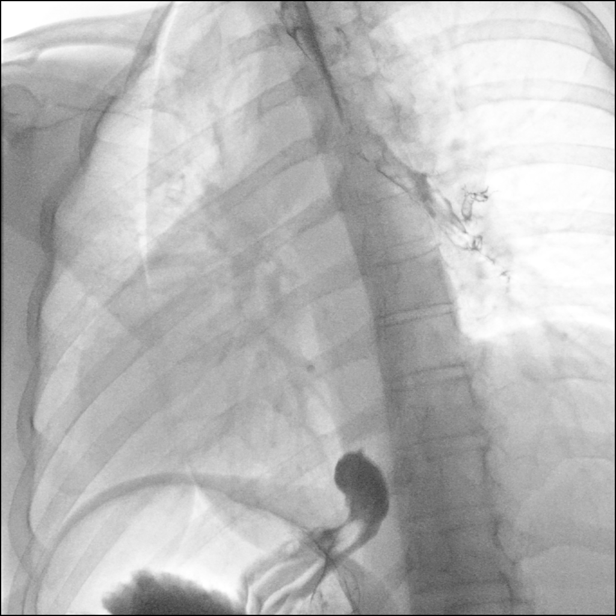
[im 20/38]
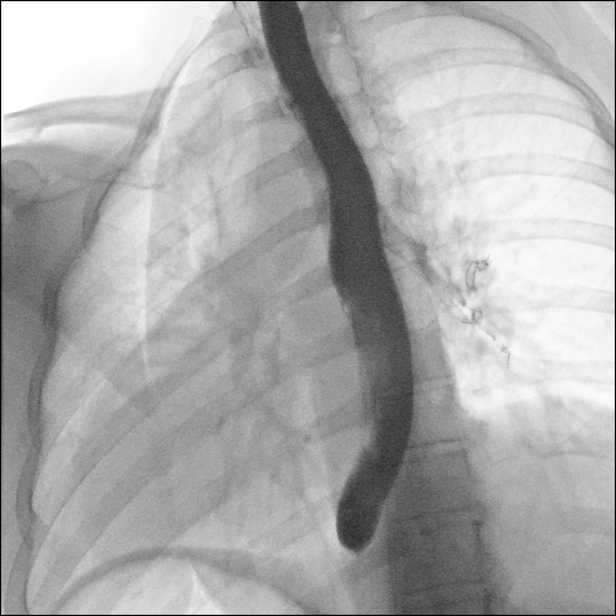
[im 21/38]
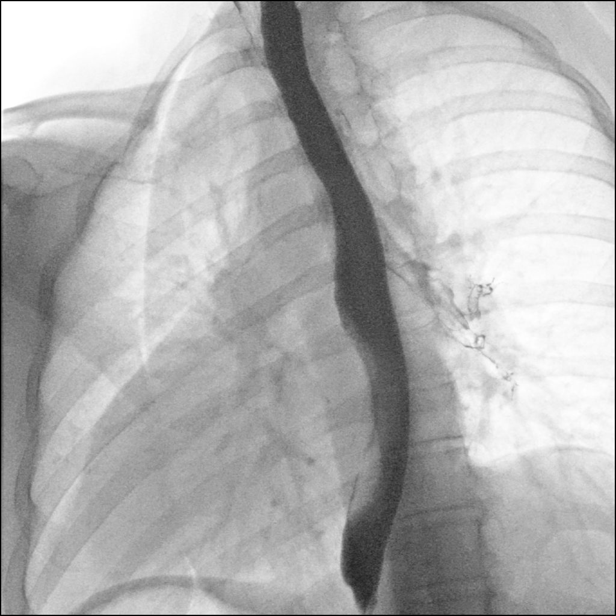
[im 25/38]
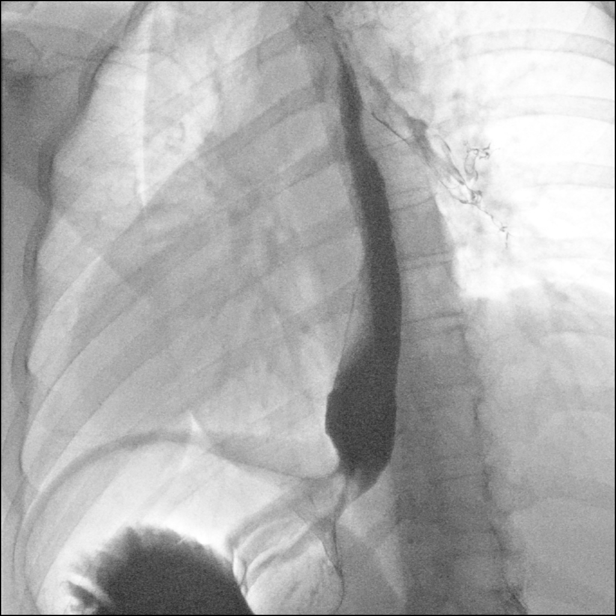
[im 26/38]
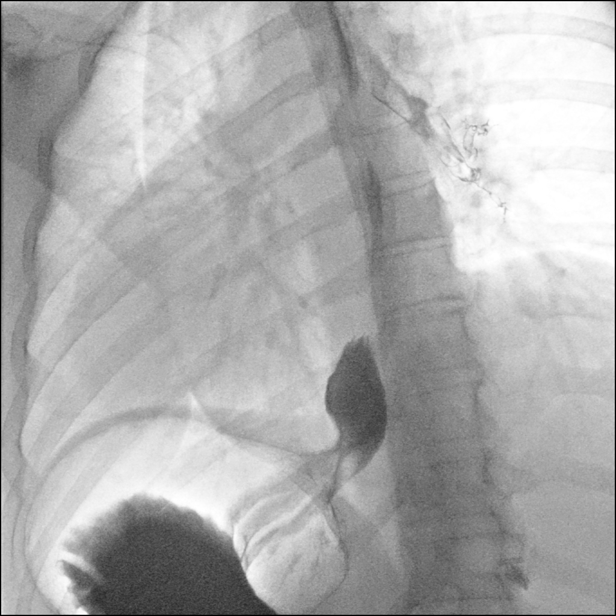
[im 29/38]
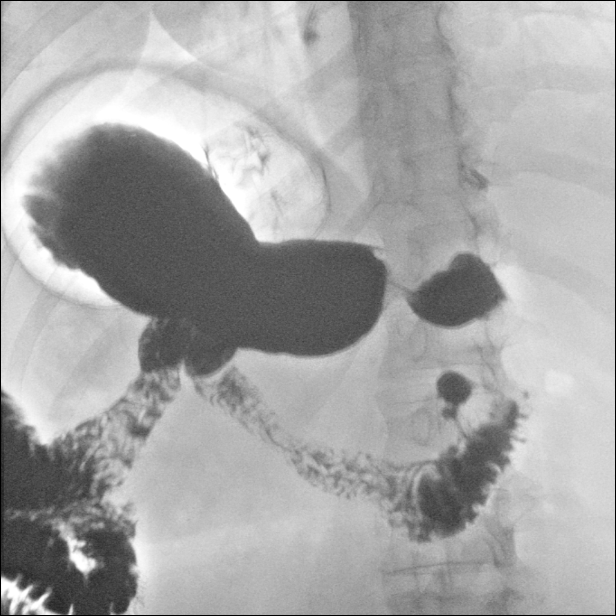
[im 33/38]
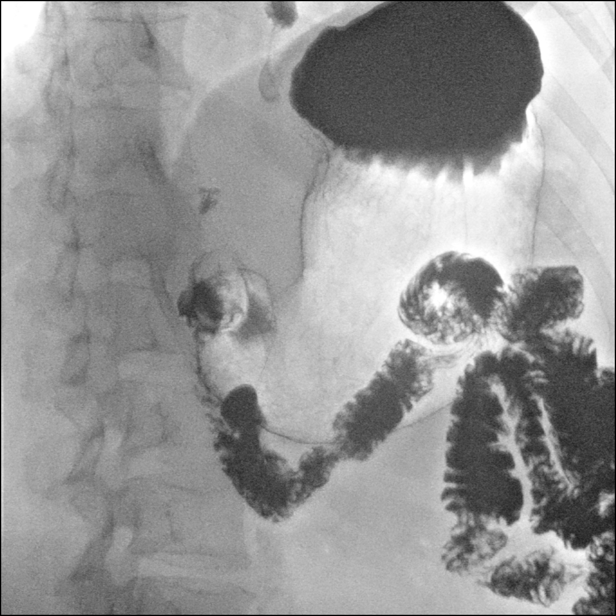
[im 34/38]
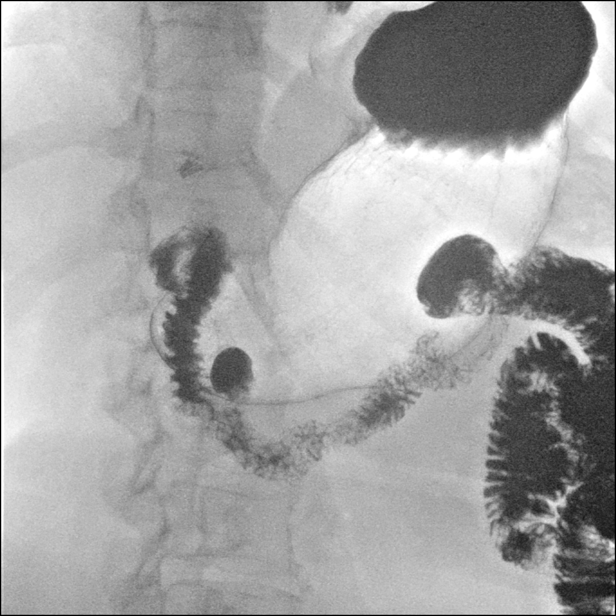
[im 38/38]
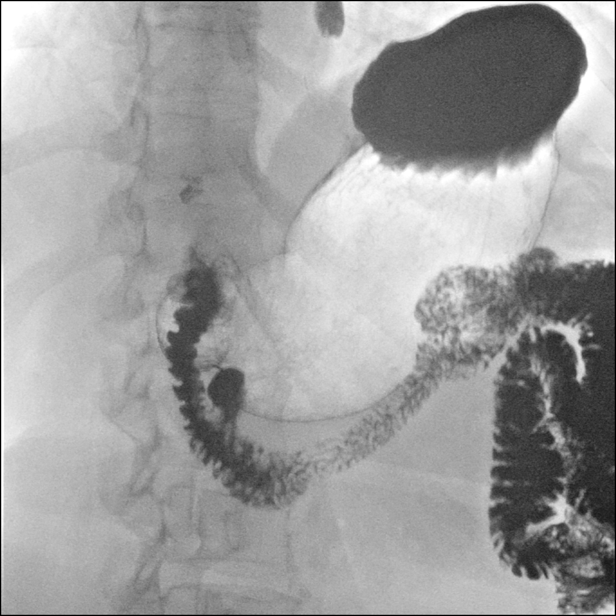

[15 of 24 positions shown; findings below may reference images not displayed]

FINDINGS: Scout view of the abdomen shows a normal bowel gas pattern. Surgical
clips in the right upper quadrant.

Double contrast examination of the upper gastrointestinal tract
shows normal esophageal motility. No esophageal fold thickening,
stricture or obstruction. Stomach and duodenal bulb are
unremarkable.

Patient experienced silent aspiration of contrast at some point
during the first half of the exam.
IMPRESSION: 1. Silent aspiration. Modified barium swallow may be of benefit, as
clinically indicated.
2. Otherwise normal exam.

## 2021-10-12 ENCOUNTER — Other Ambulatory Visit (HOSPITAL_BASED_OUTPATIENT_CLINIC_OR_DEPARTMENT_OTHER): Payer: Self-pay

## 2021-10-12 MED ORDER — FLUTICASONE FUROATE-VILANTEROL 200-25 MCG/ACT IN AEPB
1.0000 | INHALATION_SPRAY | Freq: Every day | RESPIRATORY_TRACT | 0 refills | Status: DC
  Filled 2021-10-12: qty 180, 90d supply, fill #0

## 2021-11-30 ENCOUNTER — Other Ambulatory Visit (HOSPITAL_BASED_OUTPATIENT_CLINIC_OR_DEPARTMENT_OTHER): Payer: Self-pay

## 2021-11-30 MED ORDER — CARESTART COVID-19 HOME TEST VI KIT
PACK | 0 refills | Status: DC
  Filled 2021-11-30: qty 4, 4d supply, fill #0

## 2022-02-21 ENCOUNTER — Other Ambulatory Visit (HOSPITAL_BASED_OUTPATIENT_CLINIC_OR_DEPARTMENT_OTHER): Payer: Self-pay

## 2022-02-22 ENCOUNTER — Other Ambulatory Visit (HOSPITAL_BASED_OUTPATIENT_CLINIC_OR_DEPARTMENT_OTHER): Payer: Self-pay

## 2022-02-22 MED ORDER — FLUTICASONE FUROATE-VILANTEROL 200-25 MCG/ACT IN AEPB
INHALATION_SPRAY | RESPIRATORY_TRACT | 1 refills | Status: DC
  Filled 2022-02-22: qty 180, 90d supply, fill #0

## 2022-03-01 ENCOUNTER — Other Ambulatory Visit (HOSPITAL_BASED_OUTPATIENT_CLINIC_OR_DEPARTMENT_OTHER): Payer: Self-pay

## 2022-03-07 ENCOUNTER — Other Ambulatory Visit (HOSPITAL_BASED_OUTPATIENT_CLINIC_OR_DEPARTMENT_OTHER): Payer: Self-pay

## 2022-03-07 MED ORDER — CARESTART COVID-19 HOME TEST VI KIT
PACK | 0 refills | Status: DC
  Filled 2022-03-07: qty 4, 8d supply, fill #0

## 2022-05-24 ENCOUNTER — Other Ambulatory Visit (HOSPITAL_BASED_OUTPATIENT_CLINIC_OR_DEPARTMENT_OTHER): Payer: Self-pay

## 2022-05-24 MED ORDER — FLUTICASONE FUROATE-VILANTEROL 200-25 MCG/ACT IN AEPB
1.0000 | INHALATION_SPRAY | Freq: Every day | RESPIRATORY_TRACT | 5 refills | Status: DC
  Filled 2022-05-24: qty 180, 90d supply, fill #0

## 2022-05-24 MED ORDER — CLONAZEPAM 1 MG PO TABS
1.0000 mg | ORAL_TABLET | Freq: Two times a day (BID) | ORAL | 3 refills | Status: AC | PRN
  Filled 2022-05-24: qty 60, 30d supply, fill #0
  Filled 2022-10-17: qty 60, 30d supply, fill #1

## 2022-05-25 ENCOUNTER — Other Ambulatory Visit (HOSPITAL_BASED_OUTPATIENT_CLINIC_OR_DEPARTMENT_OTHER): Payer: Self-pay

## 2022-07-23 ENCOUNTER — Other Ambulatory Visit (HOSPITAL_BASED_OUTPATIENT_CLINIC_OR_DEPARTMENT_OTHER): Payer: Self-pay

## 2022-07-23 ENCOUNTER — Ambulatory Visit
Admission: RE | Admit: 2022-07-23 | Discharge: 2022-07-23 | Disposition: A | Discharge status: Home / Self Care | Payer: No Typology Code available for payment source | Source: Ambulatory Visit | Attending: Family Medicine | Admitting: Family Medicine

## 2022-07-23 ENCOUNTER — Ambulatory Visit (INDEPENDENT_AMBULATORY_CARE_PROVIDER_SITE_OTHER): Payer: No Typology Code available for payment source

## 2022-07-23 VITALS — BP 126/81 | HR 80 | Temp 98.3°F | Resp 14

## 2022-07-23 DIAGNOSIS — R059 Cough, unspecified: Secondary | ICD-10-CM | POA: Diagnosis not present

## 2022-07-23 DIAGNOSIS — R053 Chronic cough: Secondary | ICD-10-CM | POA: Diagnosis not present

## 2022-07-23 DIAGNOSIS — Z8616 Personal history of COVID-19: Secondary | ICD-10-CM

## 2022-07-23 MED ORDER — PREDNISONE 20 MG PO TABS
ORAL_TABLET | ORAL | 0 refills | Status: DC
  Filled 2022-07-23: qty 11, 7d supply, fill #0

## 2022-07-23 MED ORDER — METHYLPREDNISOLONE SODIUM SUCC 125 MG IJ SOLR
80.0000 mg | Freq: Once | INTRAMUSCULAR | Status: AC
  Administered 2022-07-23: 80 mg via INTRAMUSCULAR

## 2022-07-23 NOTE — ED Provider Notes (Signed)
[facility]    CSN: [id number] Arrival date & time: 07/23/22  1354      History   Chief Complaint Chief Complaint  Patient presents with   Cough    Persistent for weeks - Entered by patient    HPI Nancy Richards is a 56 y.o. female.   Patient developed a non-productive cough about 3 to 4 weeks ago that has persisted.  She now has a constant burning sensation in her anterior chest but no pleuritic pain.  She denies shortness of breath and fevers, chills, and sweats.  She denies recent URI but did have COVID about 3 months ago.  She has a history of asthma and has occasionally used her albuterol inhaler.  She has used Breo inhaler in the past but not now.  She has GERD which she feels is presently controlled with Protonix 53m.  The history is provided by the patient.    Past Medical History:  Diagnosis Date   Allergic rhinitis    Anxiety    Asthma    Dysrhythmia    PACs - tx with metoprolol   Esophageal reflux    Fracture of fibula, distal, right, closed    History of cold sores    Infertility, female    Seasonal allergies     Patient Active Problem List   Diagnosis Date Noted   Asthma in adult, moderate persistent, with acute exacerbation 12/23/2017   Chronic cough 12/23/2017    Past Surgical History:  Procedure Laterality Date   ANKLE FRACTURE SURGERY Bilateral 2005, 2011   left in 2005, right in [place] 2002   x 2   CHOLECYSTECTOMY  2002   COLONOSCOPY     DILATATION & CURETTAGE/HYSTEROSCOPY WITH MYOSURE N/A 08/12/2017   [place]  Surgeon: TBrien Few MD;  [facility];  Service: Gynecology;  Laterality: N/A;   SHOULDER SURGERY Left 2014   bone spurs   WISDOM TOOTH EXTRACTION  1988    OB History   No obstetric history on file.      Home Medications    Prior to Admission medications   Medication Sig Start Date End Date Taking? Authorizing Provider   predniSONE (DELTASONE) 20 MG tablet Take one tab by mouth twice daily for 4 days, then one daily for 3 days. Take with food. 07/23/22  Yes BKandra Nicolas MD  acyclovir (ZOVIRAX) 400 MG tablet Take 400 mg by mouth 2 (two) times daily as needed (COLD SORES).     [provider]  albuterol (PROVENTIL HFA;VENTOLIN HFA) 108 (90 BASE) MCG/ACT inhaler Inhale into the lungs every 6 (six) hours as needed for wheezing or shortness of breath.    [provider]  clonazePAM (KLONOPIN) 1 MG tablet Take 1 mg by mouth at bedtime as needed.     [provider]  clonazePAM (KLONOPIN) 1 MG tablet Take 1 tablet (1 mg total) by mouth 2 (two) times daily as needed. Patient not taking: Reported on 07/23/2022 05/24/22     COVID-19 At Home Antigen Test ([facility] KIT Use as directed per package instructions Patient not taking: Reported on 07/23/2022 11/30/21   [facility] ([facility] KIT Use as directed Patient not taking: Reported on 07/23/2022 03/07/22   [facility] diclofenac Sodium (VOLTAREN) 1 % GEL APPLY 2 GRAMS TO THE AFFECTED AREA(S) BY TOPICAL  ROUTE 4 TIMES PER DAY Patient not taking: Reported on 07/23/2022 03/31/21     fluticasone furoate-vilanterol (BREO ELLIPTA) 200-25 MCG/ACT AEPB Inhale 1 puff into the lungs daily. 10/12/21     fluticasone furoate-vilanterol (BREO ELLIPTA) 200-25 MCG/ACT AEPB Inhale into the lungs by mouth 1 puff per day. Patient not taking: Reported on 07/23/2022 02/22/22     fluticasone furoate-vilanterol (BREO ELLIPTA) 200-25 MCG/ACT AEPB Inhale 1 puff by mouth into the lungs daily. Patient not taking: Reported on 07/23/2022 05/24/22     fluticasone furoate-vilanterol (BREO ELLIPTA) 200-25 MCG/INH AEPB Inhale 1 puff into the lungs at bedtime. Patient not taking: Reported on 07/23/2022    [provider]  fluticasone furoate-vilanterol (BREO ELLIPTA) 200-25 MCG/INH AEPB  Inhale 1 puff once a day Patient not taking: Reported on 07/23/2022 05/18/21     Hydrocodone-Homatropine 5-1.5 MG TABS TAKE 1 TABLET BY MOUTH EVERY 6 HOURS AS NEEDED FOR 5 DAYS Patient not taking: Reported on 07/23/2022 12/10/17   [provider]  hyoscyamine (ANASPAZ) 0.125 MG TBDP disintergrating tablet Place 1 tablet on the tongue and allow to dissolve as needed by mouth  up to 4 times per day 30 days Patient not taking: Reported on 07/23/2022 05/10/21     ibuprofen (ADVIL,MOTRIN) 200 MG tablet Take 600 mg by mouth at bedtime as needed for mild pain or moderate pain.     [provider]  levocetirizine (XYZAL) 5 MG tablet Take 5 mg by mouth every evening.    [provider]  meclizine (ANTIVERT) 25 MG tablet Take 50 mg by mouth 3 (three) times daily as needed for dizziness.    [provider]  metoprolol succinate (TOPROL-XL) 100 MG 24 hr tablet Take 100 mg by mouth at bedtime. Take with or immediately following a meal.  Patient not taking: Reported on 07/23/2022    [provider]  pantoprazole (PROTONIX) 40 MG tablet Take 40 mg by mouth at bedtime.     [provider]  pantoprazole (PROTONIX) 40 MG tablet Take 1 tablet by mouth once a day 03/06/21     sucralfate (CARAFATE) 1 g tablet TAKE 1 TABLET BY MOUTH ONCE IN THE MORNING AND ONCE IN THE EVENING ON AN EMPTY STOMACH Patient not taking: Reported on 07/23/2022 02/03/21 02/03/22  Garnette Scheuermann, PA-C  traMADol (ULTRAM) 50 MG tablet Take 1-2 tablets (50-100 mg total) by mouth every 6 (six) hours as needed. Patient not taking: Reported on 07/23/2022 08/12/17   Brien Few, MD    Family History History reviewed. No pertinent family history.  Social History Social History   Tobacco Use   Smoking status: Former    Packs/day: 1.00    Years: 25.00    Total pack years: 25.00    Types: Cigarettes    Quit date: 11/13/1999    Years since quitting: 22.7   Smokeless tobacco: Never  Vaping Use    Vaping Use: Never used  Substance Use Topics   Alcohol use: No   Drug use: No     Allergies   Aspirin and Latex   Review of Systems Review of Systems No sore throat + cough No pleuritic pain, but has constant burning sensation in chest. No wheezing No nasal congestion No post-nasal drainage No sinus pain/pressure No itchy/red eyes No earache No hemoptysis No SOB No fever/chills No nausea No vomiting No abdominal pain No diarrhea No urinary symptoms No skin rash No fatigue No myalgias No headache   Physical Exam Triage Vital Signs  ED Triage Vitals  Enc Vitals Group     BP 07/23/22 1433 126/81     Pulse Rate 07/23/22 1433 80     Resp 07/23/22 1433 14     Temp 07/23/22 1433 98.3 F (36.8 C)     Temp Source 07/23/22 1433 Oral     SpO2 07/23/22 1433 97 %     Weight --      Height --      Head Circumference --      Peak Flow --      Pain Score 07/23/22 1429 0     Pain Loc --      Pain Edu? --      Excl. in [place]? --    No data found.  Updated Vital Signs BP 126/81 (BP Location: Right Arm)   Pulse 80   Temp 98.3 F (36.8 C) (Oral)   Resp 14   SpO2 97%   Visual Acuity Right Eye Distance:   Left Eye Distance:   Bilateral Distance:    Right Eye Near:   Left Eye Near:    Bilateral Near:     Physical Exam Nursing notes and Vital Signs reviewed. Appearance:  Patient appears stated age, and in no acute distress Eyes:  Pupils are equal, round, and reactive to light and accomodation.  Extraocular movement is intact.  Conjunctivae are not inflamed  Ears:  Canals normal.  Tympanic membranes normal.  Nose:  Mildly congested turbinates.  No sinus tenderness.   Pharynx:  Normal Neck:  Supple. No adenopathy Lungs:  Clear to auscultation.  Breath sounds are equal.  Moving air well. Heart:  Regular rate and rhythm without murmurs, rubs, or gallops.  Abdomen:  Nontender without masses or hepatosplenomegaly.  Bowel sounds are present.  No CVA or flank  tenderness.  Extremities:  No edema.  Skin:  No rash present.   UC Treatments / Results  Labs (all labs ordered are listed, but only abnormal results are displayed) Labs Reviewed - No data to display  EKG   Radiology DG Chest 2 View  Result Date: 07/23/2022 CLINICAL DATA:  Chronic cough, COVID 3 months ago EXAM: CHEST - 2 VIEW COMPARISON:  02/06/2018 FINDINGS: The heart size and mediastinal contours are within normal limits. Both lungs are clear. The visualized skeletal structures are unremarkable. IMPRESSION: No active cardiopulmonary disease. Electronically Signed   By: Jerilynn Mages.  Shick M.D.   On: 07/23/2022 16:29    Procedures Procedures (including critical care time)  Medications Ordered in UC Medications  methylPREDNISolone sodium succinate (SOLU-MEDROL) 125 mg/2 mL injection 80 mg (has no administration in time range)    Initial Impression / Assessment and Plan / UC Course  I have reviewed the triage vital signs and the nursing notes.  Pertinent labs & imaging results that were available during my care of the patient were reviewed by me and considered in my medical decision making (see chart for details).    Normal chest x-ray reassuring. Administered Solumedrol 80mg  IM; then begin prednisone burst/taper. Followup with pulmonologist.  Final Clinical Impressions(s) / UC Diagnoses   Final diagnoses:  Persistent cough for 3 weeks or longer     Discharge Instructions      Begin prednisone Tuesday 07/24/22. If cough recurs, recommend evaluation by ENT for possible laryngeal hypersensitivity.    ED Prescriptions     Medication Sig Dispense Auth. Provider   predniSONE (DELTASONE) 20 MG tablet Take one tab by mouth twice daily for 4 days, then one  daily for 3 days. Take with food. 11 tablet Kandra Nicolas, MD         Kandra Nicolas, MD 07/25/22 2021

## 2022-07-23 NOTE — ED Triage Notes (Signed)
Pt presents with c/o cough that began 3-4 weeks ago.

## 2022-07-23 NOTE — Discharge Instructions (Signed)
Begin prednisone Tuesday 07/24/22. If cough recurs, recommend evaluation by ENT for possible laryngeal hypersensitivity.

## 2022-07-24 ENCOUNTER — Ambulatory Visit: Payer: Self-pay

## 2022-08-03 ENCOUNTER — Ambulatory Visit (INDEPENDENT_AMBULATORY_CARE_PROVIDER_SITE_OTHER): Payer: No Typology Code available for payment source | Admitting: Internal Medicine

## 2022-08-03 ENCOUNTER — Encounter: Payer: Self-pay | Admitting: Internal Medicine

## 2022-08-03 ENCOUNTER — Other Ambulatory Visit (HOSPITAL_BASED_OUTPATIENT_CLINIC_OR_DEPARTMENT_OTHER): Payer: Self-pay

## 2022-08-03 DIAGNOSIS — R053 Chronic cough: Secondary | ICD-10-CM

## 2022-08-03 MED ORDER — FLUARIX QUADRIVALENT 0.5 ML IM SUSY
PREFILLED_SYRINGE | INTRAMUSCULAR | 0 refills | Status: AC
  Filled 2022-08-03: qty 0.5, 1d supply, fill #0

## 2022-08-03 MED ORDER — BUDESONIDE-FORMOTEROL FUMARATE 80-4.5 MCG/ACT IN AERO
INHALATION_SPRAY | RESPIRATORY_TRACT | 12 refills | Status: AC
  Filled 2022-08-03 – 2022-12-22 (×2): qty 10.2, 30d supply, fill #0
  Filled 2023-02-04: qty 10.2, 30d supply, fill #1
  Filled 2023-02-27 – 2023-02-28 (×2): qty 10.2, 30d supply, fill #2
  Filled 2023-04-30: qty 10.2, 30d supply, fill #3

## 2022-08-03 MED ORDER — PANTOPRAZOLE SODIUM 40 MG PO TBEC
40.0000 mg | DELAYED_RELEASE_TABLET | Freq: Every day | ORAL | 2 refills | Status: DC
  Filled 2022-08-03: qty 90, 90d supply, fill #0

## 2022-08-03 MED ORDER — FAMOTIDINE 20 MG PO TABS
20.0000 mg | ORAL_TABLET | Freq: Every day | ORAL | 11 refills | Status: AC
  Filled 2022-08-03: qty 30, 30d supply, fill #0
  Filled 2022-09-04 – 2022-09-21 (×2): qty 30, 30d supply, fill #1
  Filled 2022-10-17 – 2022-12-16 (×2): qty 30, 30d supply, fill #2
  Filled 2023-02-04: qty 30, 30d supply, fill #3
  Filled 2023-02-27 – 2023-02-28 (×2): qty 30, 30d supply, fill #4
  Filled 2023-04-30: qty 30, 30d supply, fill #0

## 2022-08-03 NOTE — Assessment & Plan Note (Signed)
Onset around Jan 2022, worse since covid 04/2022 - d/c breo 200 08/03/2022 - 08/03/2022  Changed to symb 80 2bid and max gerd rx   Cough variant asthma vs Upper airway cough syndrome (previously labeled PNDS),  is so named because it's frequently impossible to sort out how much is  CR/sinusitis with freq throat clearing (which can be related to primary GERD)   vs  causing  secondary (" extra esophageal")  GERD from wide swings in gastric pressure that occur with throat clearing, often  promoting self use of mint and menthol lozenges that reduce the lower esophageal sphincter tone and exacerbate the problem further in a cyclical fashion.   These are the same pts (now being labeled as having "irritable larynx syndrome" by some cough centers) who not infrequently have a history of having failed to tolerate ace inhibitors,  dry powder inhalers or biphosphonates or report having atypical/extraesophageal reflux symptoms that don't respond to standard doses of PPI  and are easily confused as having aecopd or asthma flares by even experienced allergists/ pulmonologists (myself included).  Strongly suspect this is a reaction to high dose DPI vs GERD or combination of the two so rec  1) change to symb 80 2bid x 2 weeks then consider taper 2) max rx for gerd / cyclical coughing with hard rock candy to prevent excess throat clearing   F/u in 6 weeks to regroup, call sooner if needed - bring inhalers to assure optimal hfa         Each maintenance medication was reviewed in detail including emphasizing most importantly the difference between maintenance and prns and under what circumstances the prns are to be triggered using an action plan format where appropriate.  Total time for H and P, chart review, counseling, reviewing hfa device(s) and generating customized AVS unique to this office visit / same day charting  > 30 mi with pt new to me

## 2022-08-03 NOTE — Patient Instructions (Signed)
Pantoprazole (protonix) 40 mg   Take  30-60 min before first meal of the day and Pepcid (famotidine)  20 mg after supper until return to office - this is the best way to tell whether stomach acid is contributing to your problem.     Plan A = Automatic = Always=  Symbicort 80 Take 2 puffs first thing in am and then another 2 puffs about 12 hours later.     Work on inhaler technique:  relax and gently blow all the way out then take a nice smooth full deep breath back in, triggering the inhaler at same time you start breathing in.  Hold breath in for at least  5 seconds if you can. Blow out symbicort  thru nose. Rinse and gargle with water when done.  If mouth or throat bother you at all,  try brushing teeth/gums/tongue with arm and hammer toothpaste/ make a slurry and gargle and spit out.       Plan B = Backup (to supplement plan A, not to replace it) Only use your albuterol inhaler as a rescue medication to be used if you can't catch your breath by resting or doing a relaxed purse lip breathing pattern.  - The less you use it, the better it will work when you need it. - Ok to use the inhaler up to 2 puffs  every 4 hours if you must but call for appointment if use goes up over your usual need - Don't leave home without it !!  (think of it like the spare tire for your car)   Plan C = Crisis (instead of Plan B but only if Plan B stops working) - only use your albuterol nebulizer if you first try Plan B and it fails to help > ok to use the nebulizer up to every 4 hours but if start needing it regularly call for immediate appointment     GERD (REFLUX)  is an extremely common cause of respiratory symptoms just like yours , many times with no obvious heartburn at all.    It can be treated with medication, but also with lifestyle changes including elevation of the head of your bed (ideally with 6 -8inch blocks under the headboard of your bed),  Smoking cessation, avoidance of late meals, excessive  alcohol, and avoid fatty foods, chocolate, peppermint, colas, red wine, and acidic juices such as orange juice.  NO MINT OR MENTHOL PRODUCTS SO NO COUGH DROPS  USE SUGARLESS CANDY INSTEAD (Jolley ranchers or Stover's or Life Savers) or even ice chips will also do - the key is to swallow to prevent all throat clearing. NO OIL BASED VITAMINS - use powdered substitutes.  Avoid fish oil when coughing.    Please schedule a follow up office visit in 6 weeks, call sooner if needed

## 2022-08-03 NOTE — Progress Notes (Addendum)
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    History of Present Illness  08/03/2022  Pulmonary/ 1st office eval/Nancy Richards maint on BREO 200 Chief Complaint  Patient presents with   Consult    She had covid in 04/2022  and still having some dry cough mostly and has some yellow thick sputum at times.   Dyspnea:  MMRC1 = can walk nl pace, flat grade, can't hurry or go uphills or steps s sob   Cough: sensation of tickle worse as day goes on / min yellow thick mucus /allegra 180 not helping/ no assoc nasal symptoms Sleep: sleeps fine flat p ppi hs and wakes up fine usual hour SABA use: no hfa helping    No obvious other  day to day or daytime pattern/variability or assoc excess/ purulent sputum or mucus plugs or hemoptysis or cp or chest tightness, subjective wheeze or overt sinus or hb symptoms.   Sleeping  without nocturnal  or early am exacerbation  of respiratory  c/o's or need for noct saba. Also denies any obvious fluctuation of symptoms with weather or environmental changes or other aggravating or alleviating factors except as outlined above   No unusual exposure hx or h/o childhood pna/ asthma or knowledge of premature birth.  Current Allergies, Complete Past Medical History, Past Surgical History, Family History, and Social History were reviewed in [employer] record.  ROS  The following are not active complaints unless bolded Hoarseness, sore throat, dysphagia, dental problems, itching,  sneezing,  nasal congestion or discharge of excess mucus or purulent secretions, ear ache,   fever, chills, sweats, unintended wt loss or wt gain, classically pleuritic or exertional cp,  orthopnea pnd or arm/hand swelling  or leg swelling, presyncope, palpitations, abdominal pain, anorexia, nausea, vomiting, diarrhea  or change in bowel habits or change in bladder habits, change in stools or change in urine, dysuria, hematuria,  rash, arthralgias, visual complaints, headache, numbness, weakness or ataxia or problems with walking or coordination,  change in mood or  memory.           Past Medical History:  Diagnosis Date   Allergic rhinitis    Anxiety    Asthma    Dysrhythmia    PACs - tx with metoprolol   Esophageal reflux    Fracture of fibula, distal, right, closed    History of cold sores    Infertility, female    Seasonal allergies     Outpatient Medications Prior to Visit - - NOTE:   Unable to verify as accurately reflecting what pt takes    Medication Sig Dispense Refill   acyclovir (ZOVIRAX) 400 MG tablet Take 400 mg by mouth 2 (two) times daily as needed (COLD SORES).      albuterol (PROVENTIL HFA;VENTOLIN HFA) 108 (90 BASE) MCG/ACT inhaler Inhale into the lungs every 6 (six) hours as needed for wheezing or shortness of breath.  clonazePAM (KLONOPIN) 1 MG tablet Take 1 mg by mouth at bedtime as needed.      clonazePAM (KLONOPIN) 1 MG tablet Take 1 tablet (1 mg total) by mouth 2 (two) times daily as needed. 60 tablet 3   fluticasone furoate-vilanterol (BREO ELLIPTA) 200-25 MCG/ACT AEPB Inhale 1 puff into the lungs daily. 180 each 0   fluticasone furoate-vilanterol (BREO ELLIPTA) 200-25 MCG/ACT AEPB Inhale into the lungs by mouth 1 puff per day. 180 each 1   fluticasone furoate-vilanterol (BREO ELLIPTA) 200-25 MCG/ACT AEPB Inhale 1 puff by mouth into the lungs daily. 180 each 5   fluticasone furoate-vilanterol (BREO ELLIPTA) 200-25 MCG/INH AEPB Inhale 1 puff into the lungs at  bedtime.     ibuprofen (ADVIL,MOTRIN) 200 MG tablet Take 600 mg by mouth at bedtime as needed for mild pain or moderate pain.      levocetirizine (XYZAL) 5 MG tablet Take 5 mg by mouth every evening.     meclizine (ANTIVERT) 25 MG tablet Take 50 mg by mouth 3 (three) times daily as needed for dizziness.     metoprolol succinate (TOPROL-XL) 100 MG 24 hr tablet Take 100 mg by mouth at bedtime. Take with or immediately following a meal.     pantoprazole (PROTONIX) 40 MG tablet Take 40 mg by mouth at bedtime.      pantoprazole (PROTONIX) 40 MG tablet Take 1 tablet by mouth once a day 90 tablet 2   COVID-19 At Home Antigen Test ([facility] COVID-19 HOME TEST) KIT Use as directed per package instructions (Patient not taking: Reported on 07/23/2022) 4 each 0   COVID-19 At Home Antigen Test ([facility] COVID-19 HOME TEST) KIT Use as directed (Patient not taking: Reported on 07/23/2022) 4 kit 0   diclofenac Sodium (VOLTAREN) 1 % GEL APPLY 2 GRAMS TO THE AFFECTED AREA(S) BY TOPICAL ROUTE 4 TIMES PER DAY (Patient not taking: Reported on 07/23/2022) 100 g 1   fluticasone furoate-vilanterol (BREO ELLIPTA) 200-25 MCG/INH AEPB Inhale 1 puff once a day (Patient not taking: Reported on 07/23/2022) 180 each 0   Hydrocodone-Homatropine 5-1.5 MG TABS TAKE 1 TABLET BY MOUTH EVERY 6 HOURS AS NEEDED FOR 5 DAYS (Patient not taking: Reported on 07/23/2022)  0   hyoscyamine (ANASPAZ) 0.125 MG TBDP disintergrating tablet Place 1 tablet on the tongue and allow to dissolve as needed by mouth  up to 4 times per day 30 days (Patient not taking: Reported on 07/23/2022) 120 tablet 1   predniSONE (DELTASONE) 20 MG tablet Take one tablet by mouth twice daily for 4 days, then one daily for 3 days. Take with food. 11 tablet 0   sucralfate (CARAFATE) 1 g tablet TAKE 1 TABLET BY MOUTH ONCE IN THE MORNING AND ONCE IN THE EVENING ON AN EMPTY STOMACH (Patient not taking: Reported on 07/23/2022) 60 tablet 1   traMADol (ULTRAM) 50 MG tablet Take 1-2  tablets (50-100 mg total) by mouth every 6 (six) hours as needed. (Patient not taking: Reported on 07/23/2022) 20 tablet 0   No facility-administered medications prior to visit.     Objective:     BP 120/76 (BP Location: Left Arm, Cuff Size: Normal)   Pulse 68   Temp 98.2 F (36.8 C)   Ht 5' 4"  (1.626 m)   Wt 196 lb 6.4 oz (89.1 kg)   SpO2 97% Comment: ra  BMI 33.71 kg/m   SpO2: 97 % (ra)  Pleasant amb wf harsh dry sounding upper airway cough    HEENT : Oropharynx  clear     Nasal turbinates nl    NECK :  without  apparent JVD/ palpable Nodes/TM    LUNGS: no acc muscle use,  Nl contour chest which is clear to A and P bilaterally without cough on insp or exp maneuvers   CV:  RRR  no s3 or murmur or increase in P2, and no edema   ABD:  soft and nontender with nl inspiratory excursion in the supine position. No bruits or organomegaly appreciated   MS:  Nl gait/ ext warm without deformities Or obvious joint restrictions  calf tenderness, cyanosis or clubbing    SKIN: warm and dry without lesions    NEURO:  alert, approp, nl sensorium with  no motor or cerebellar deficits apparent.       I personally reviewed images and agree with radiology impression as follows:  CXR:   07/23/22 pa and lateral No active cardiopulmonary disease.  Assessment   Chronic cough Onset around Jan 2022, worse since covid 04/2022 - d/c breo 200 08/03/2022 - 08/03/2022  Changed to symb 80 2bid and max gerd rx   Cough variant asthma vs Upper airway cough syndrome (previously labeled PNDS),  is so named because it's frequently impossible to sort out how much is  CR/sinusitis with freq throat clearing (which can be related to primary GERD)   vs  causing  secondary (" extra esophageal")  GERD from wide swings in gastric pressure that occur with throat clearing, often  promoting self use of mint and menthol lozenges that reduce the lower esophageal sphincter tone and exacerbate the problem further in a  cyclical fashion.   These are the same pts (now being labeled as having "irritable larynx syndrome" by some cough centers) who not infrequently have a history of having failed to tolerate ace inhibitors,  dry powder inhalers or biphosphonates or report having atypical/extraesophageal reflux symptoms that don't respond to standard doses of PPI  and are easily confused as having aecopd or asthma flares by even experienced allergists/ pulmonologists (myself included).  Strongly suspect this is a reaction to high dose DPI vs GERD or combination of the two so rec  1) change to symb 80 2bid x 2 weeks then consider taper 2) max rx for gerd / cyclical coughing with hard rock candy to prevent excess throat clearing   F/u in 6 weeks to regroup, call sooner if needed - bring inhalers to assure optimal hfa         Each maintenance medication was reviewed in detail including emphasizing most importantly the difference between maintenance and prns and under what circumstances the prns are to be triggered using an action plan format where appropriate.  Total time for H and P, chart review, counseling, reviewing hfa device(s) and generating customized AVS unique to this office visit / same day charting  > 30 mi with pt new to me           Christinia Gully, MD 08/03/2022

## 2022-08-17 ENCOUNTER — Other Ambulatory Visit (HOSPITAL_BASED_OUTPATIENT_CLINIC_OR_DEPARTMENT_OTHER): Payer: Self-pay

## 2022-09-04 ENCOUNTER — Other Ambulatory Visit (HOSPITAL_BASED_OUTPATIENT_CLINIC_OR_DEPARTMENT_OTHER): Payer: Self-pay

## 2022-09-14 ENCOUNTER — Other Ambulatory Visit (HOSPITAL_BASED_OUTPATIENT_CLINIC_OR_DEPARTMENT_OTHER): Payer: Self-pay

## 2022-09-14 ENCOUNTER — Ambulatory Visit: Payer: No Typology Code available for payment source | Admitting: Internal Medicine

## 2022-09-21 ENCOUNTER — Other Ambulatory Visit (HOSPITAL_BASED_OUTPATIENT_CLINIC_OR_DEPARTMENT_OTHER): Payer: Self-pay

## 2022-10-07 NOTE — Progress Notes (Signed)
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    History of Present Illness  08/03/2022  Pulmonary/ 1st office eval/Eathon Valade maint on BREO 200 Chief Complaint  Patient presents with   Consult    She had covid in 04/2022  and still having some dry cough mostly and has some yellow thick sputum at times.   Dyspnea:  MMRC1 = can walk nl pace, flat grade, can't hurry or go uphills or steps s sob   Cough: sensation of tickle worse as day goes on / min yellow thick mucus /allegra 180 not helping/ no assoc nasal symptoms Sleep: sleeps fine flat p ppi hs and wakes up fine usual hour SABA use: no hfa helping   Rec Pantoprazole (protonix) 40 mg   Take  30-60 min before first meal of the day and Pepcid (famotidine)  20 mg after supper until return to office  Plan A = Automatic = Always=  Symbicort 80 Take 2 puffs first thing in am and then another 2 puffs about 12 hours later.  Work on inhaler technique:  Plan B = Backup (to supplement plan A, not to replace it) Only use your albuterol inhaler as a rescue medication Plan C = Crisis (instead of Plan B but only if Plan B stops working) - only use your albuterol nebulizer if you first try Plan B  GERD diet reviewed, bed blocks rec     10/08/2022  f/u ov/Woodruff Skirvin re: cough  maint on symbicort 80 Take 2 puffs first thing in am and then another 2 puffs about 12 hours later.  Chief Complaint  Patient presents with   Follow-up    Cough is better  Dyspnea:  same on symbicot  80  as BRE0  Cough: nl hour maybe 15 min to clear/ worse with laughter otherwise no daytime cough  Sleeping: flat bed 2pillows on side/no bed blocks SABA use: none  02: none  Covid status:   vax x 2    No obvious day to day or daytime variability or assoc purulent sputum or mucus plugs or hemoptysis or cp or chest tightness, subjective wheeze or overt sinus or hb symptoms.   Sleeping  without nocturnal  exacerbation  of respiratory  c/o's or need for noct saba. Also denies any obvious fluctuation of symptoms with weather or environmental changes or other aggravating or alleviating factors except as outlined above   No unusual exposure hx or h/o childhood pna/ asthma or knowledge of premature birth.  Current Allergies, Complete Past Medical History, Past Surgical History, Family History, and Social History were reviewed in [employer] record.  ROS  The following are not active complaints unless bolded Hoarseness, sore throat, dysphagia, dental problems, itching, sneezing,  nasal congestion or discharge of excess mucus or purulent secretions, ear ache,   fever, chills, sweats, unintended wt loss or wt gain, classically pleuritic or exertional cp,  orthopnea pnd  or arm/hand swelling  or leg swelling, presyncope, palpitations, abdominal pain, anorexia, nausea, vomiting, diarrhea  or change in bowel habits or change in bladder habits, change in stools or change in urine, dysuria, hematuria,  rash, arthralgias, visual complaints, headache, numbness, weakness or ataxia or problems with walking or coordination,  change in mood or  memory.        Current Meds  Medication Sig   acyclovir (ZOVIRAX) 400 MG tablet Take 400 mg by mouth 2 (two) times daily as needed (COLD SORES).    albuterol (PROVENTIL HFA;VENTOLIN HFA) 108 (90 BASE) MCG/ACT inhaler Inhale into the lungs every 6 (six) hours as needed for wheezing or shortness of breath.   budesonide-formoterol (SYMBICORT) 80-4.5  MCG/ACT inhaler Inhale 2 puffs by mouth first thing in am and then another 2 puffs about 12 hours later.   clonazePAM (KLONOPIN) 1 MG tablet Take 1 mg by mouth at bedtime as needed.    clonazePAM (KLONOPIN) 1 MG tablet Take 1 tablet (1 mg total) by mouth 2 (two) times daily as needed.   famotidine (PEPCID) 20 MG tablet Take 1 tablet (20 mg total) by mouth daily after supper.   ibuprofen (ADVIL,MOTRIN) 200 MG tablet Take 600 mg by mouth at bedtime as needed for mild pain or moderate pain.    influenza vac split quadrivalent PF (FLUARIX QUADRIVALENT) 0.5 ML injection Inject into the muscle.   pantoprazole (PROTONIX) 40 MG tablet Take 1 tablet (40 mg total) by mouth daily. Take 30-60 min before first meal of the day             Past Medical History:  Diagnosis Date   Allergic rhinitis    Anxiety    Asthma    Dysrhythmia    PACs - tx with metoprolol   Esophageal reflux    Fracture of fibula, distal, right, closed    History of cold sores    Infertility, female    Seasonal allergies        Objective:    Wt Readings from Last 3 Encounters:  10/08/22 195 lb (88.5 kg)  08/03/22 196 lb 6.4 oz (89.1 kg)  12/23/17 214 lb 6.4 oz (97.3 kg)      Vital signs reviewed  10/08/2022  - Note at rest 02 sats  96% on RA   General appearance:    amb jovial wf / coughs when laughs hard only    HEENT : Oropharynx  clear s viz pnds      Nasal turbinates nl    NECK :  without  apparent JVD/ palpable Nodes/TM    LUNGS: no acc muscle use,  Nl contour chest which is clear to A and P bilaterally without cough on insp or exp maneuvers   CV:  RRR  no s3 or murmur or increase in P2, and no edema   ABD:  soft and nontender with nl inspiratory excursion in the supine position. No bruits or organomegaly appreciated   MS:  Nl gait/ ext warm without deformities Or obvious joint restrictions  calf tenderness, cyanosis or clubbing    SKIN: warm and dry without lesions    NEURO:  alert, approp, nl  sensorium with  no motor or cerebellar deficits apparent.            Assessment

## 2022-10-08 ENCOUNTER — Encounter: Payer: Self-pay | Admitting: Internal Medicine

## 2022-10-08 ENCOUNTER — Ambulatory Visit (INDEPENDENT_AMBULATORY_CARE_PROVIDER_SITE_OTHER): Payer: No Typology Code available for payment source | Admitting: Internal Medicine

## 2022-10-08 VITALS — BP 114/70 | HR 81 | Temp 98.2°F | Ht 64.0 in | Wt 195.0 lb

## 2022-10-08 DIAGNOSIS — R053 Chronic cough: Secondary | ICD-10-CM | POA: Diagnosis not present

## 2022-10-08 NOTE — Assessment & Plan Note (Signed)
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   These are the same pts (now being labeled as having "irritable larynx syndrome" by some cough centers) who not infrequently have a history of having failed to tolerate ace inhibitors,  dry powder inhalers(esp high doses)  or biphosphonates or report having atypical/extraesophageal reflux symptoms that don't respond to standard doses of PPI  and are easily confused as having aecopd or asthma flares by even experienced allergists/ pulmonologists (myself included).   Advised to continue symbicort 80 2bid and f/u with Dr Barnetta Chapel for refills or here prn         Each maintenance medication was reviewed in detail including emphasizing most importantly the difference between maintenance and prns and under what circumstances the prns are to be triggered using an action plan format where appropriate.  Total time for H and P, chart review, counseling, reviewing hfa  device(s) and generating customized AVS unique to this office visit / same day charting = 20 min summary f/u ov

## 2022-10-08 NOTE — Patient Instructions (Signed)
Continue symbicort 80 Take 2 puffs first thing in am and then another 2 puffs about 12 hours later.     If you are satisfied with your treatment plan,  let your doctor know and he/she can either refill your medications or you can return here when your prescription runs out.     If in any way you are not 100% satisfied,  please tell us.  If 100% better, tell your friends!  Pulmonary follow up is as needed

## 2022-10-17 ENCOUNTER — Other Ambulatory Visit: Payer: Self-pay | Admitting: Internal Medicine

## 2022-10-17 ENCOUNTER — Other Ambulatory Visit (HOSPITAL_COMMUNITY): Payer: Self-pay

## 2022-10-18 ENCOUNTER — Other Ambulatory Visit (HOSPITAL_COMMUNITY): Payer: Self-pay

## 2022-10-18 MED ORDER — PANTOPRAZOLE SODIUM 40 MG PO TBEC
40.0000 mg | DELAYED_RELEASE_TABLET | Freq: Every day | ORAL | 2 refills | Status: AC
  Filled 2022-10-18: qty 30, 30d supply, fill #0

## 2022-10-23 ENCOUNTER — Other Ambulatory Visit (HOSPITAL_COMMUNITY): Payer: Self-pay

## 2022-12-22 ENCOUNTER — Other Ambulatory Visit (HOSPITAL_BASED_OUTPATIENT_CLINIC_OR_DEPARTMENT_OTHER): Payer: Self-pay

## 2023-02-04 ENCOUNTER — Other Ambulatory Visit (HOSPITAL_BASED_OUTPATIENT_CLINIC_OR_DEPARTMENT_OTHER): Payer: Self-pay

## 2023-02-27 ENCOUNTER — Other Ambulatory Visit (HOSPITAL_BASED_OUTPATIENT_CLINIC_OR_DEPARTMENT_OTHER): Payer: Self-pay

## 2023-02-28 ENCOUNTER — Other Ambulatory Visit (HOSPITAL_BASED_OUTPATIENT_CLINIC_OR_DEPARTMENT_OTHER): Payer: Self-pay

## 2023-03-05 ENCOUNTER — Other Ambulatory Visit (HOSPITAL_BASED_OUTPATIENT_CLINIC_OR_DEPARTMENT_OTHER): Payer: Self-pay

## 2023-03-14 ENCOUNTER — Other Ambulatory Visit (HOSPITAL_BASED_OUTPATIENT_CLINIC_OR_DEPARTMENT_OTHER): Payer: Self-pay

## 2023-03-20 ENCOUNTER — Other Ambulatory Visit (HOSPITAL_BASED_OUTPATIENT_CLINIC_OR_DEPARTMENT_OTHER): Payer: Self-pay

## 2023-04-11 DIAGNOSIS — H524 Presbyopia: Secondary | ICD-10-CM | POA: Diagnosis not present

## 2023-04-30 ENCOUNTER — Other Ambulatory Visit (HOSPITAL_BASED_OUTPATIENT_CLINIC_OR_DEPARTMENT_OTHER): Payer: Self-pay

## 2023-05-18 ENCOUNTER — Other Ambulatory Visit (HOSPITAL_BASED_OUTPATIENT_CLINIC_OR_DEPARTMENT_OTHER): Payer: Self-pay

## 2023-05-30 ENCOUNTER — Other Ambulatory Visit (HOSPITAL_BASED_OUTPATIENT_CLINIC_OR_DEPARTMENT_OTHER): Payer: Self-pay

## 2023-05-30 DIAGNOSIS — Z131 Encounter for screening for diabetes mellitus: Secondary | ICD-10-CM | POA: Diagnosis not present

## 2023-05-30 DIAGNOSIS — M722 Plantar fascial fibromatosis: Secondary | ICD-10-CM | POA: Diagnosis not present

## 2023-05-30 DIAGNOSIS — Z0000 Encounter for general adult medical examination without abnormal findings: Secondary | ICD-10-CM | POA: Diagnosis not present

## 2023-05-30 DIAGNOSIS — J45909 Unspecified asthma, uncomplicated: Secondary | ICD-10-CM | POA: Diagnosis not present

## 2023-05-30 DIAGNOSIS — E785 Hyperlipidemia, unspecified: Secondary | ICD-10-CM | POA: Diagnosis not present

## 2023-05-30 DIAGNOSIS — F419 Anxiety disorder, unspecified: Secondary | ICD-10-CM | POA: Diagnosis not present

## 2023-05-30 MED ORDER — MELOXICAM 15 MG PO TABS
15.0000 mg | ORAL_TABLET | Freq: Every day | ORAL | 3 refills | Status: AC
  Filled 2023-05-30: qty 30, 30d supply, fill #0

## 2023-06-13 ENCOUNTER — Other Ambulatory Visit (HOSPITAL_BASED_OUTPATIENT_CLINIC_OR_DEPARTMENT_OTHER): Payer: Self-pay

## 2023-06-19 ENCOUNTER — Other Ambulatory Visit: Payer: Self-pay | Admitting: Podiatry

## 2023-06-19 DIAGNOSIS — M79672 Pain in left foot: Secondary | ICD-10-CM

## 2023-06-20 ENCOUNTER — Encounter: Payer: Self-pay | Admitting: Podiatry

## 2023-06-20 ENCOUNTER — Ambulatory Visit (INDEPENDENT_AMBULATORY_CARE_PROVIDER_SITE_OTHER): Payer: 59

## 2023-06-20 ENCOUNTER — Ambulatory Visit: Payer: 59 | Admitting: Podiatry

## 2023-06-20 DIAGNOSIS — M7732 Calcaneal spur, left foot: Secondary | ICD-10-CM | POA: Diagnosis not present

## 2023-06-20 DIAGNOSIS — M79671 Pain in right foot: Secondary | ICD-10-CM | POA: Diagnosis not present

## 2023-06-20 DIAGNOSIS — M722 Plantar fascial fibromatosis: Secondary | ICD-10-CM

## 2023-06-20 DIAGNOSIS — S82401A Unspecified fracture of shaft of right fibula, initial encounter for closed fracture: Secondary | ICD-10-CM | POA: Diagnosis not present

## 2023-06-20 DIAGNOSIS — M79672 Pain in left foot: Secondary | ICD-10-CM | POA: Diagnosis not present

## 2023-06-20 MED ORDER — TRIAMCINOLONE ACETONIDE 10 MG/ML IJ SUSP
2.5000 mg | Freq: Once | INTRAMUSCULAR | Status: AC
  Administered 2023-06-20: 2.5 mg via INTRA_ARTICULAR

## 2023-06-20 MED ORDER — DEXAMETHASONE SODIUM PHOSPHATE 120 MG/30ML IJ SOLN
4.0000 mg | Freq: Once | INTRAMUSCULAR | Status: AC
  Administered 2023-06-20: 4 mg via INTRA_ARTICULAR

## 2023-06-20 NOTE — Patient Instructions (Signed)

## 2023-06-20 NOTE — Progress Notes (Signed)
  Subjective:  Patient ID: Nancy Richards, female    DOB: [birth year],   MRN: [id number]  No chief complaint on file.   57 y.o. female presents for concern of bilateral heel pain more so on the right than the left. Has been dealing with this for several months on the right and a few weeks on the left. Relates she has tried NSAIDs, massage, lidocaine patches and supportive shoes with no relief. 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 Relates first steps after rest are the worst.   . Denies any other pedal complaints. Denies n/v/f/c.   Past Medical History:  Diagnosis Date   Allergic rhinitis    Anxiety    Asthma    Dysrhythmia    PACs - tx with metoprolol   Esophageal reflux    Fracture of fibula, distal, right, closed    History of cold sores    Infertility, female    Seasonal allergies     Objective:  Physical Exam: Vascular: DP/PT pulses 2/4 bilateral. CFT <3 seconds. Normal hair growth on digits. No edema.  Skin. No lacerations or abrasions bilateral feet.  Musculoskeletal: MMT 5/5 bilateral lower extremities in DF, PF, Inversion and Eversion. Deceased ROM in DF of ankle joint. Tender to medial calcaneal tubercle bilateral more so on the right. No pain with calcaneal squeeze and no pain with PT achilles or arch bilateral.  Neurological: Sensation intact to light touch.   Assessment:   1. Plantar fasciitis of left foot   2. Plantar fasciitis, right      Plan:  Patient was evaluated and treated and all questions answered. Discussed plantar fasciitis with patient.  X-rays reviewed and discussed with patient. No acute fractures or dislocations noted. Mild spurring noted at inferior calcaneus. Reetained hardware in left ankle from previous fracture and mild pes cavus noted bilateral.  Discussed treatment options including, ice, NSAIDS, supportive shoes, bracing, and stretching. Stretching exercises provided to be done on a daily basis.   Discussed trying the meloxicam she  has and see if this works better than the ibuprofen.  Plantar fascial strapping preformed for patient today and advised will let know when braces come in.  Patient requesting injection today. Procedure note below.   Follow-up 6 weeks or sooner if any problems arise. In the meantime, encouraged to call the office with any questions, concerns, change in symptoms.   Procedure:  Discussed etiology, pathology, conservative vs. surgical therapies. At this time a plantar fascial injection was recommended.  The patient agreed and a sterile skin prep was applied.  An injection consisting of  1cc dexamethasone 0.5 cc kenalog and 1cc marcaine mixture was infiltrated at the point of maximal tenderness on the right Heel.  Bandaid applied. The patient tolerated this well and was given instructions for aftercare.    Louann Sjogren, DPM

## 2023-07-25 DIAGNOSIS — M25531 Pain in right wrist: Secondary | ICD-10-CM | POA: Diagnosis not present

## 2023-07-26 ENCOUNTER — Other Ambulatory Visit (HOSPITAL_BASED_OUTPATIENT_CLINIC_OR_DEPARTMENT_OTHER): Payer: Self-pay | Admitting: Family Medicine

## 2023-07-26 DIAGNOSIS — M25531 Pain in right wrist: Secondary | ICD-10-CM

## 2023-07-30 ENCOUNTER — Ambulatory Visit (HOSPITAL_BASED_OUTPATIENT_CLINIC_OR_DEPARTMENT_OTHER)
Admission: RE | Admit: 2023-07-30 | Discharge: 2023-07-30 | Disposition: A | Discharge status: Home / Self Care | Payer: 59 | Source: Ambulatory Visit | Attending: Family Medicine | Admitting: Family Medicine

## 2023-07-30 DIAGNOSIS — M25531 Pain in right wrist: Secondary | ICD-10-CM | POA: Insufficient documentation

## 2023-07-31 DIAGNOSIS — Z1331 Encounter for screening for depression: Secondary | ICD-10-CM | POA: Diagnosis not present

## 2023-07-31 DIAGNOSIS — Z01411 Encounter for gynecological examination (general) (routine) with abnormal findings: Secondary | ICD-10-CM | POA: Diagnosis not present

## 2023-07-31 DIAGNOSIS — Z113 Encounter for screening for infections with a predominantly sexual mode of transmission: Secondary | ICD-10-CM | POA: Diagnosis not present

## 2023-07-31 DIAGNOSIS — Z01419 Encounter for gynecological examination (general) (routine) without abnormal findings: Secondary | ICD-10-CM | POA: Diagnosis not present

## 2023-07-31 DIAGNOSIS — Z1231 Encounter for screening mammogram for malignant neoplasm of breast: Secondary | ICD-10-CM | POA: Diagnosis not present

## 2023-07-31 DIAGNOSIS — Z124 Encounter for screening for malignant neoplasm of cervix: Secondary | ICD-10-CM | POA: Diagnosis not present

## 2023-08-01 ENCOUNTER — Ambulatory Visit: Payer: 59 | Admitting: Podiatry

## 2023-08-09 ENCOUNTER — Ambulatory Visit: Payer: 59 | Admitting: Podiatry

## 2023-08-21 DIAGNOSIS — S63591A Other specified sprain of right wrist, initial encounter: Secondary | ICD-10-CM | POA: Diagnosis not present

## 2023-08-22 ENCOUNTER — Other Ambulatory Visit (HOSPITAL_BASED_OUTPATIENT_CLINIC_OR_DEPARTMENT_OTHER): Payer: Self-pay

## 2023-08-22 ENCOUNTER — Encounter: Payer: Self-pay | Admitting: Podiatry

## 2023-08-22 ENCOUNTER — Ambulatory Visit (INDEPENDENT_AMBULATORY_CARE_PROVIDER_SITE_OTHER): Payer: 59 | Admitting: Podiatry

## 2023-08-22 DIAGNOSIS — M722 Plantar fascial fibromatosis: Secondary | ICD-10-CM

## 2023-08-22 DIAGNOSIS — B353 Tinea pedis: Secondary | ICD-10-CM | POA: Diagnosis not present

## 2023-08-22 MED ORDER — KETOCONAZOLE 2 % EX CREA
1.0000 | TOPICAL_CREAM | Freq: Every day | CUTANEOUS | 2 refills | Status: AC
  Filled 2023-08-22: qty 60, 60d supply, fill #0
  Filled 2024-07-07: qty 60, 60d supply, fill #1

## 2023-08-22 NOTE — Progress Notes (Signed)
  Subjective:  Patient ID: Nancy Richards, female    DOB: 09-Dec-1965,   MRN: [id number]  No chief complaint on file.   57 y.o. female presents for follow-up of bilateral plantar fasciiits but mostly the right. Relates doing much better about 65%. Relates she has been stretching and wearing support. The shot did help a lot. Relates some itchiness on her feet.    . Denies any other pedal complaints. Denies n/v/f/c.   Past Medical History:  Diagnosis Date   Allergic rhinitis    Anxiety    Asthma    Dysrhythmia    PACs - tx with metoprolol   Esophageal reflux    Fracture of fibula, distal, right, closed    History of cold sores    Infertility, female    Seasonal allergies     Objective:  Physical Exam: Vascular: DP/PT pulses 2/4 bilateral. CFT <3 seconds. Normal hair growth on digits. No edema.  Skin. No lacerations or abrasions bilateral feet. Sclaing and erythema noted to bilatearl plantar feet.  Musculoskeletal: MMT 5/5 bilateral lower extremities in DF, PF, Inversion and Eversion. Deceased ROM in DF of ankle joint. Mildly tender to medial calcaneal tubercle bilateral more so on the right. No pain with calcaneal squeeze and no pain with PT achilles or arch bilateral.  Neurological: Sensation intact to light touch.   Assessment:   1. Plantar fasciitis, right   2. Plantar fasciitis of left foot   3. Tinea pedis of both feet      Plan:  Patient was evaluated and treated and all questions answered. Discussed plantar fasciitis with patient.  X-rays reviewed and discussed with patient. No acute fractures or dislocations noted. Mild spurring noted at inferior calcaneus. Reetained hardware in left ankle from previous fracture and mild pes cavus noted bilateral.  Discussed treatment options including, ice, NSAIDS, supportive shoes, bracing, and stretching. Stretching exercises provided to be done on a daily basis.   Continue anti-inflammatories. PF brace dispensed.  Ketoconazole  provided for tinea pedis  Patient defers injection today.  Follow-up 6 weeks or sooner if any problems arise. In the meantime, encouraged to call the office with any questions, concerns, change in symptoms.     Louann Sjogren, DPM

## 2023-09-02 ENCOUNTER — Other Ambulatory Visit (HOSPITAL_BASED_OUTPATIENT_CLINIC_OR_DEPARTMENT_OTHER): Payer: Self-pay

## 2023-09-02 MED ORDER — FLULAVAL 0.5 ML IM SUSY
0.5000 mL | PREFILLED_SYRINGE | Freq: Once | INTRAMUSCULAR | 0 refills | Status: AC
  Filled 2023-09-02: qty 0.5, 1d supply, fill #0

## 2023-10-02 DIAGNOSIS — S63591A Other specified sprain of right wrist, initial encounter: Secondary | ICD-10-CM | POA: Diagnosis not present

## 2023-10-25 DIAGNOSIS — M25531 Pain in right wrist: Secondary | ICD-10-CM | POA: Diagnosis not present

## 2023-11-20 DIAGNOSIS — S63591A Other specified sprain of right wrist, initial encounter: Secondary | ICD-10-CM | POA: Diagnosis not present

## 2023-12-19 ENCOUNTER — Other Ambulatory Visit (HOSPITAL_BASED_OUTPATIENT_CLINIC_OR_DEPARTMENT_OTHER): Payer: Self-pay

## 2023-12-19 ENCOUNTER — Encounter (HOSPITAL_BASED_OUTPATIENT_CLINIC_OR_DEPARTMENT_OTHER): Payer: Self-pay

## 2023-12-19 ENCOUNTER — Other Ambulatory Visit: Payer: Self-pay | Admitting: Internal Medicine

## 2024-02-26 DIAGNOSIS — R509 Fever, unspecified: Secondary | ICD-10-CM | POA: Diagnosis not present

## 2024-02-26 DIAGNOSIS — J020 Streptococcal pharyngitis: Secondary | ICD-10-CM | POA: Diagnosis not present

## 2024-02-26 DIAGNOSIS — I10 Essential (primary) hypertension: Secondary | ICD-10-CM | POA: Diagnosis not present

## 2024-04-07 ENCOUNTER — Other Ambulatory Visit (HOSPITAL_BASED_OUTPATIENT_CLINIC_OR_DEPARTMENT_OTHER): Payer: Self-pay

## 2024-04-07 ENCOUNTER — Encounter (HOSPITAL_BASED_OUTPATIENT_CLINIC_OR_DEPARTMENT_OTHER): Payer: Self-pay

## 2024-04-07 ENCOUNTER — Ambulatory Visit (HOSPITAL_BASED_OUTPATIENT_CLINIC_OR_DEPARTMENT_OTHER): Admit: 2024-04-07 | Admitting: Orthopedic Surgery

## 2024-04-07 DIAGNOSIS — M65831 Other synovitis and tenosynovitis, right forearm: Secondary | ICD-10-CM | POA: Diagnosis not present

## 2024-04-07 DIAGNOSIS — M25331 Other instability, right wrist: Secondary | ICD-10-CM | POA: Diagnosis not present

## 2024-04-07 DIAGNOSIS — X58XXXA Exposure to other specified factors, initial encounter: Secondary | ICD-10-CM | POA: Diagnosis not present

## 2024-04-07 DIAGNOSIS — G8929 Other chronic pain: Secondary | ICD-10-CM | POA: Diagnosis not present

## 2024-04-07 DIAGNOSIS — M65941 Unspecified synovitis and tenosynovitis, right hand: Secondary | ICD-10-CM | POA: Diagnosis not present

## 2024-04-07 DIAGNOSIS — S63014A Dislocation of distal radioulnar joint of right wrist, initial encounter: Secondary | ICD-10-CM | POA: Diagnosis not present

## 2024-04-07 DIAGNOSIS — Y999 Unspecified external cause status: Secondary | ICD-10-CM | POA: Diagnosis not present

## 2024-04-07 DIAGNOSIS — S63591A Other specified sprain of right wrist, initial encounter: Secondary | ICD-10-CM | POA: Diagnosis not present

## 2024-04-07 SURGERY — ARTHROSCOPY, WRIST
Anesthesia: Regional | Site: Wrist | Laterality: Right

## 2024-04-07 MED ORDER — OXYCODONE HCL 5 MG PO TABS
5.0000 mg | ORAL_TABLET | Freq: Four times a day (QID) | ORAL | 0 refills | Status: AC
  Filled 2024-04-07: qty 20, 5d supply, fill #0

## 2024-04-20 DIAGNOSIS — M25531 Pain in right wrist: Secondary | ICD-10-CM | POA: Diagnosis not present

## 2024-04-27 DIAGNOSIS — M25531 Pain in right wrist: Secondary | ICD-10-CM | POA: Diagnosis not present

## 2024-05-04 DIAGNOSIS — M25531 Pain in right wrist: Secondary | ICD-10-CM | POA: Diagnosis not present

## 2024-05-11 DIAGNOSIS — M25531 Pain in right wrist: Secondary | ICD-10-CM | POA: Diagnosis not present

## 2024-05-18 DIAGNOSIS — G8918 Other acute postprocedural pain: Secondary | ICD-10-CM | POA: Diagnosis not present

## 2024-05-18 DIAGNOSIS — S63591A Other specified sprain of right wrist, initial encounter: Secondary | ICD-10-CM | POA: Diagnosis not present

## 2024-05-18 DIAGNOSIS — M25531 Pain in right wrist: Secondary | ICD-10-CM | POA: Diagnosis not present

## 2024-05-25 DIAGNOSIS — M25531 Pain in right wrist: Secondary | ICD-10-CM | POA: Diagnosis not present

## 2024-06-01 DIAGNOSIS — M25531 Pain in right wrist: Secondary | ICD-10-CM | POA: Diagnosis not present

## 2024-06-08 DIAGNOSIS — M25531 Pain in right wrist: Secondary | ICD-10-CM | POA: Diagnosis not present

## 2024-06-11 ENCOUNTER — Other Ambulatory Visit (HOSPITAL_BASED_OUTPATIENT_CLINIC_OR_DEPARTMENT_OTHER): Payer: Self-pay

## 2024-06-11 DIAGNOSIS — F419 Anxiety disorder, unspecified: Secondary | ICD-10-CM | POA: Diagnosis not present

## 2024-06-11 DIAGNOSIS — Z0000 Encounter for general adult medical examination without abnormal findings: Secondary | ICD-10-CM | POA: Diagnosis not present

## 2024-06-11 DIAGNOSIS — R002 Palpitations: Secondary | ICD-10-CM | POA: Diagnosis not present

## 2024-06-11 DIAGNOSIS — Z6835 Body mass index (BMI) 35.0-35.9, adult: Secondary | ICD-10-CM | POA: Diagnosis not present

## 2024-06-11 DIAGNOSIS — E785 Hyperlipidemia, unspecified: Secondary | ICD-10-CM | POA: Diagnosis not present

## 2024-06-11 DIAGNOSIS — J45909 Unspecified asthma, uncomplicated: Secondary | ICD-10-CM | POA: Diagnosis not present

## 2024-06-11 MED ORDER — BUDESONIDE-FORMOTEROL FUMARATE 80-4.5 MCG/ACT IN AERO
2.0000 | INHALATION_SPRAY | Freq: Two times a day (BID) | RESPIRATORY_TRACT | 5 refills | Status: AC
  Filled 2024-06-11: qty 10.2, 30d supply, fill #0
  Filled 2024-07-07: qty 10.2, 30d supply, fill #1
  Filled 2024-11-26: qty 10.2, 30d supply, fill #2

## 2024-06-11 MED ORDER — METOPROLOL SUCCINATE ER 50 MG PO TB24
50.0000 mg | ORAL_TABLET | Freq: Every day | ORAL | 3 refills | Status: AC
  Filled 2024-06-11: qty 30, 30d supply, fill #0
  Filled 2024-07-07: qty 30, 30d supply, fill #1
  Filled 2024-11-26: qty 30, 30d supply, fill #2

## 2024-06-11 MED ORDER — CLONAZEPAM 1 MG PO TABS
1.0000 mg | ORAL_TABLET | Freq: Every day | ORAL | 0 refills | Status: AC
  Filled 2024-06-11: qty 30, 30d supply, fill #0

## 2024-06-12 ENCOUNTER — Other Ambulatory Visit (HOSPITAL_BASED_OUTPATIENT_CLINIC_OR_DEPARTMENT_OTHER): Payer: Self-pay

## 2024-06-12 MED ORDER — AMOXICILLIN 500 MG PO CAPS
500.0000 mg | ORAL_CAPSULE | Freq: Four times a day (QID) | ORAL | 0 refills | Status: DC
  Filled 2024-06-12: qty 28, 7d supply, fill #0

## 2024-06-15 DIAGNOSIS — M25531 Pain in right wrist: Secondary | ICD-10-CM | POA: Diagnosis not present

## 2024-06-22 DIAGNOSIS — M25531 Pain in right wrist: Secondary | ICD-10-CM | POA: Diagnosis not present

## 2024-06-29 DIAGNOSIS — M25531 Pain in right wrist: Secondary | ICD-10-CM | POA: Diagnosis not present

## 2024-07-07 ENCOUNTER — Other Ambulatory Visit (HOSPITAL_BASED_OUTPATIENT_CLINIC_OR_DEPARTMENT_OTHER): Payer: Self-pay

## 2024-07-10 DIAGNOSIS — M25531 Pain in right wrist: Secondary | ICD-10-CM | POA: Diagnosis not present

## 2024-07-15 DIAGNOSIS — M25531 Pain in right wrist: Secondary | ICD-10-CM | POA: Diagnosis not present

## 2024-07-22 DIAGNOSIS — M25531 Pain in right wrist: Secondary | ICD-10-CM | POA: Diagnosis not present

## 2024-09-11 ENCOUNTER — Other Ambulatory Visit (HOSPITAL_BASED_OUTPATIENT_CLINIC_OR_DEPARTMENT_OTHER): Payer: Self-pay

## 2024-09-11 MED ORDER — FLUZONE 0.5 ML IM SUSY
0.5000 mL | PREFILLED_SYRINGE | Freq: Once | INTRAMUSCULAR | 0 refills | Status: AC
  Filled 2024-09-11: qty 0.5, 1d supply, fill #0

## 2024-09-29 ENCOUNTER — Other Ambulatory Visit (HOSPITAL_COMMUNITY): Payer: Self-pay

## 2024-09-29 ENCOUNTER — Other Ambulatory Visit: Payer: Self-pay

## 2024-11-19 ENCOUNTER — Telehealth: Admitting: Emergency Medicine

## 2024-11-19 DIAGNOSIS — J189 Pneumonia, unspecified organism: Secondary | ICD-10-CM

## 2024-11-19 MED ORDER — AZITHROMYCIN 250 MG PO TABS: ORAL_TABLET | ORAL | 0 refills | Status: AC

## 2024-11-19 MED ORDER — PROMETHAZINE-DM 6.25-15 MG/5ML PO SYRP: 5.0000 mL | ORAL_SOLUTION | Freq: Four times a day (QID) | ORAL | 0 refills | Status: DC | PRN

## 2024-11-19 MED ORDER — PREDNISONE 20 MG PO TABS: 40.0000 mg | ORAL_TABLET | Freq: Every day | ORAL | 0 refills | Status: DC

## 2024-11-19 MED ORDER — AMOXICILLIN-POT CLAVULANATE 875-125 MG PO TABS: 1.0000 | ORAL_TABLET | Freq: Two times a day (BID) | ORAL | 0 refills | Status: AC

## 2024-11-19 NOTE — Progress Notes (Signed)
 " Virtual Visit Consent   Nancy Richards, you are scheduled for a virtual visit with a [facility] provider today. Just as with appointments in the office, your consent must be obtained to participate. Your consent will be active for this visit and any virtual visit you may have with one of our providers in the next 365 days. If you have a MyChart account, a copy of this consent can be sent to you electronically.  As this is a virtual visit, video technology does not allow for your provider to perform a traditional examination. This may limit your provider's ability to fully assess your condition. If your provider identifies any concerns that need to be evaluated in person or the need to arrange testing (such as labs, EKG, etc.), we will make arrangements to do so. Although advances in technology are sophisticated, we cannot ensure that it will always work on either your end or our end. If the connection with a video visit is poor, the visit may have to be switched to a telephone visit. With either a video or telephone visit, we are not always able to ensure that we have a secure connection.  By engaging in this virtual visit, you consent to the provision of healthcare and authorize for your insurance to be billed (if applicable) for the services provided during this visit. Depending on your insurance coverage, you may receive a charge related to this service.  I need to obtain your verbal consent now. Are you willing to proceed with your visit today? Nancy Richards has provided verbal consent on 11/19/2024 for a virtual visit (video or telephone). Jon CHRISTELLA Belt, NP  Date: 11/19/2024 9:50 AM   Virtual Visit via Video Note   I, Jon CHRISTELLA Belt, connected with  Nancy Richards  ([id number], [birth year]) on 11/19/2024 at  9:30 AM EST by a video-enabled telemedicine application and verified that I am speaking with the correct person using two identifiers.  Location: Patient: Virtual Visit Location Patient:  Home Provider: Virtual Visit Location Provider: Home Office   I discussed the limitations of evaluation and management by telemedicine and the availability of in person appointments. The patient expressed understanding and agreed to proceed.    History of Present Illness: Nancy Richards is a 59 y.o. who identifies as a female who was assigned female at birth, and is being seen today for chest congestion  Sick since 11/10/24. Thought she might have had the flu. Had fever, nasal congestion, fatigue. Started coughing about a week ago. Now has headache and cough and soreness in right upper chest when she takes a deep breath. No fever for a few days. Not sure if wheezing or just full of chest congestion. Sputum is yellowish chunks.  Frequent coughing spasms  Hx asthma. Uses symbicort .   Taken mucinex, sudafed. Has been needing to use albuterol  inhaler and nebulizer.   HPI: HPI  Problems:  Patient Active Problem List   Diagnosis Date Noted   Asthma in adult, moderate persistent, with acute exacerbation 12/23/2017   Chronic cough 12/23/2017    Allergies: Allergies[1] Medications: Current Medications[2]  Observations/Objective: Patient is well-developed, well-nourished in no acute distress.  Resting comfortably  at home.  Head is normocephalic, atraumatic.  No labored breathing.  Speech is clear and coherent with logical content.  Patient is alert and oriented at baseline.    Assessment and Plan: 1. Community acquired pneumonia, unspecified laterality (Primary) - promethazine -dextromethorphan (PROMETHAZINE -DM) 6.25-15 MG/5ML syrup; Take 5 mLs by mouth  4 (four) times daily as needed for cough.  Dispense: 120 mL; Refill: 0 - predniSONE  (DELTASONE ) 20 MG tablet; Take 2 tablets (40 mg total) by mouth daily with breakfast for 5 days.  Dispense: 10 tablet; Refill: 0 - azithromycin  (ZITHROMAX ) 250 MG tablet; Take 2 tablets on day 1, then 1 tablet daily on days 2 through 5  Dispense: 6 tablet;  Refill: 0 - amoxicillin -clavulanate (AUGMENTIN ) 875-125 MG tablet; Take 1 tablet by mouth 2 (two) times daily.  Dispense: 14 tablet; Refill: 0  I am concerned about cap for her. Illness is also aggravating her asthma.   Follow Up Instructions: I discussed the assessment and treatment plan with the patient. The patient was provided an opportunity to ask questions and all were answered. The patient agreed with the plan and demonstrated an understanding of the instructions.  A copy of instructions were sent to the patient via MyChart unless otherwise noted below.   The patient was advised to call back or seek an in-person evaluation if the symptoms worsen or if the condition fails to improve as anticipated.    Jon CHRISTELLA Belt, NP    [1]  Allergies Allergen Reactions   Aspirin Hives   Latex Dermatitis    Just with ADHESIVES and latex BANDAIDS    Cat Dander Other (See Comments)   Dust Mite Extract Other (See Comments)   Molds & Smuts Nausea Only  [2]  Current Outpatient Medications:    amoxicillin -clavulanate (AUGMENTIN ) 875-125 MG tablet, Take 1 tablet by mouth 2 (two) times daily., Disp: 14 tablet, Rfl: 0   azithromycin  (ZITHROMAX ) 250 MG tablet, Take 2 tablets on day 1, then 1 tablet daily on days 2 through 5, Disp: 6 tablet, Rfl: 0   predniSONE  (DELTASONE ) 20 MG tablet, Take 2 tablets (40 mg total) by mouth daily with breakfast for 5 days., Disp: 10 tablet, Rfl: 0   promethazine -dextromethorphan (PROMETHAZINE -DM) 6.25-15 MG/5ML syrup, Take 5 mLs by mouth 4 (four) times daily as needed for cough., Disp: 120 mL, Rfl: 0   acyclovir (ZOVIRAX) 400 MG tablet, Take 400 mg by mouth 2 (two) times daily as needed (COLD SORES). , Disp: , Rfl:    albuterol  (PROVENTIL  HFA;VENTOLIN  HFA) 108 (90 BASE) MCG/ACT inhaler, Inhale into the lungs every 6 (six) hours as needed for wheezing or shortness of breath., Disp: , Rfl:    budesonide -formoterol  (SYMBICORT ) 80-4.5 MCG/ACT inhaler, Inhale 2 puffs by  mouth first thing in am and then another 2 puffs about 12 hours later., Disp: 10.2 g, Rfl: 12   budesonide -formoterol  (SYMBICORT ) 80-4.5 MCG/ACT inhaler, Inhale 2 puffs into the lungs 2 (two) times daily., Disp: 10.2 g, Rfl: 5   clonazePAM  (KLONOPIN ) 1 MG tablet, Take 1 mg by mouth at bedtime as needed. , Disp: , Rfl:    clonazePAM  (KLONOPIN ) 1 MG tablet, Take 1 tablet (1 mg total) by mouth 2 (two) times daily as needed., Disp: 60 tablet, Rfl: 3   clonazePAM  (KLONOPIN ) 1 MG tablet, Take 1 tablet (1 mg total) by mouth daily., Disp: 30 tablet, Rfl: 0   famotidine  (PEPCID ) 20 MG tablet, Take 1 tablet (20 mg total) by mouth daily after supper., Disp: 30 tablet, Rfl: 11   ibuprofen (ADVIL,MOTRIN) 200 MG tablet, Take 600 mg by mouth at bedtime as needed for mild pain or moderate pain. , Disp: , Rfl:    influenza vac split quadrivalent PF (FLUARIX  QUADRIVALENT) 0.5 ML injection, Inject into the muscle., Disp: 0.5 mL, Rfl: 0   ketoconazole  (NIZORAL )  2 % cream, Apply 1 Application topically daily., Disp: 60 g, Rfl: 2   meloxicam  (MOBIC ) 15 MG tablet, Take 1 tablet (15 mg total) by mouth daily., Disp: 30 tablet, Rfl: 3   metoprolol  succinate (TOPROL -XL) 50 MG 24 hr tablet, Take 1 tablet (50 mg total) by mouth daily., Disp: 30 tablet, Rfl: 3   oxyCODONE  (OXY IR/ROXICODONE ) 5 MG immediate release tablet, Take 1 tablet (5 mg total) by mouth every 6 (six) hours for pain., Disp: 20 tablet, Rfl: 0   pantoprazole  (PROTONIX ) 40 MG tablet, Take 1 tablet (40 mg total) by mouth daily. Take 30-60 minutes before first meal of the day, Disp: 30 tablet, Rfl: 2  "

## 2024-11-19 NOTE — Patient Instructions (Signed)
 " Nancy Richards, thank you for joining Jon CHRISTELLA Belt, NP for today's virtual visit.  While this provider is not your primary care provider (PCP), if your PCP is located in our provider database this encounter information will be shared with them immediately following your visit.   A [place] MyChart account gives you access to today's visit and all your visits, tests, and labs performed at [facility]  click here if you don't have a [place] MyChart account or go to [email or web address]  Consent: (Patient) Nancy Richards provided verbal consent for this virtual visit at the beginning of the encounter.  Current Medications:  Current Outpatient Medications:    amoxicillin -clavulanate (AUGMENTIN ) 875-125 MG tablet, Take 1 tablet by mouth 2 (two) times daily., Disp: 14 tablet, Rfl: 0   azithromycin  (ZITHROMAX ) 250 MG tablet, Take 2 tablets on day 1, then 1 tablet daily on days 2 through 5, Disp: 6 tablet, Rfl: 0   predniSONE  (DELTASONE ) 20 MG tablet, Take 2 tablets (40 mg total) by mouth daily with breakfast for 5 days., Disp: 10 tablet, Rfl: 0   promethazine -dextromethorphan (PROMETHAZINE -DM) 6.25-15 MG/5ML syrup, Take 5 mLs by mouth 4 (four) times daily as needed for cough., Disp: 120 mL, Rfl: 0   acyclovir (ZOVIRAX) 400 MG tablet, Take 400 mg by mouth 2 (two) times daily as needed (COLD SORES). , Disp: , Rfl:    albuterol  (PROVENTIL  HFA;VENTOLIN  HFA) 108 (90 BASE) MCG/ACT inhaler, Inhale into the lungs every 6 (six) hours as needed for wheezing or shortness of breath., Disp: , Rfl:    budesonide -formoterol  (SYMBICORT ) 80-4.5 MCG/ACT inhaler, Inhale 2 puffs by mouth first thing in am and then another 2 puffs about 12 hours later., Disp: 10.2 g, Rfl: 12   budesonide -formoterol  (SYMBICORT ) 80-4.5 MCG/ACT inhaler, Inhale 2 puffs into the lungs 2 (two) times daily., Disp: 10.2 g, Rfl: 5   clonazePAM  (KLONOPIN ) 1 MG tablet, Take 1 mg by mouth at bedtime as needed. , Disp: , Rfl:     clonazePAM  (KLONOPIN ) 1 MG tablet, Take 1 tablet (1 mg total) by mouth 2 (two) times daily as needed., Disp: 60 tablet, Rfl: 3   clonazePAM  (KLONOPIN ) 1 MG tablet, Take 1 tablet (1 mg total) by mouth daily., Disp: 30 tablet, Rfl: 0   famotidine  (PEPCID ) 20 MG tablet, Take 1 tablet (20 mg total) by mouth daily after supper., Disp: 30 tablet, Rfl: 11   ibuprofen (ADVIL,MOTRIN) 200 MG tablet, Take 600 mg by mouth at bedtime as needed for mild pain or moderate pain. , Disp: , Rfl:    influenza vac split quadrivalent PF (FLUARIX  QUADRIVALENT) 0.5 ML injection, Inject into the muscle., Disp: 0.5 mL, Rfl: 0   ketoconazole  (NIZORAL ) 2 % cream, Apply 1 Application topically daily., Disp: 60 g, Rfl: 2   meloxicam  (MOBIC ) 15 MG tablet, Take 1 tablet (15 mg total) by mouth daily., Disp: 30 tablet, Rfl: 3   metoprolol  succinate (TOPROL -XL) 50 MG 24 hr tablet, Take 1 tablet (50 mg total) by mouth daily., Disp: 30 tablet, Rfl: 3   oxyCODONE  (OXY IR/ROXICODONE ) 5 MG immediate release tablet, Take 1 tablet (5 mg total) by mouth every 6 (six) hours for pain., Disp: 20 tablet, Rfl: 0   pantoprazole  (PROTONIX ) 40 MG tablet, Take 1 tablet (40 mg total) by mouth daily. Take 30-60 minutes before first meal of the day, Disp: 30 tablet, Rfl: 2   Medications ordered in this encounter:  Meds ordered this encounter  Medications   promethazine -dextromethorphan (  PROMETHAZINE -DM) 6.25-15 MG/5ML syrup    Sig: Take 5 mLs by mouth 4 (four) times daily as needed for cough.    Dispense:  120 mL    Refill:  0   predniSONE  (DELTASONE ) 20 MG tablet    Sig: Take 2 tablets (40 mg total) by mouth daily with breakfast for 5 days.    Dispense:  10 tablet    Refill:  0   azithromycin  (ZITHROMAX ) 250 MG tablet    Sig: Take 2 tablets on day 1, then 1 tablet daily on days 2 through 5    Dispense:  6 tablet    Refill:  0   amoxicillin -clavulanate (AUGMENTIN ) 875-125 MG tablet    Sig: Take 1 tablet by mouth 2 (two) times daily.     Dispense:  14 tablet    Refill:  0     *If you need refills on other medications prior to your next appointment, please contact your pharmacy*  Follow-Up: Call back or seek an in-person evaluation if the symptoms worsen or if the condition fails to improve as anticipated.  [facility] [phone]  Other Instructions  Continue mucinex.   If this treatment does not help you improve, or if you are feeling worse instead of better, please be seen in person   If you have been instructed to have an in-person evaluation today at a local Urgent Care facility, please use the link below. It will take you to a list of all of our available [place] [facility], including address, phone number and hours of operation. Please do not delay care.  [facility]  If you or a family member do not have a primary care provider, use the link below to schedule a visit and establish care. When you choose a [facility] primary care physician or advanced practice provider, you gain a long-term partner in health. Find a Primary Care Provider  Learn more about 's in-office and virtual care options:  - Get Care Now  "

## 2024-11-24 ENCOUNTER — Emergency Department (HOSPITAL_BASED_OUTPATIENT_CLINIC_OR_DEPARTMENT_OTHER)
Admission: EM | Admit: 2024-11-24 | Discharge: 2024-11-24 | Disposition: A | Discharge status: Home / Self Care | Attending: Emergency Medicine | Admitting: Emergency Medicine

## 2024-11-24 ENCOUNTER — Emergency Department (HOSPITAL_BASED_OUTPATIENT_CLINIC_OR_DEPARTMENT_OTHER)

## 2024-11-24 ENCOUNTER — Encounter (HOSPITAL_BASED_OUTPATIENT_CLINIC_OR_DEPARTMENT_OTHER): Payer: Self-pay | Admitting: Emergency Medicine

## 2024-11-24 ENCOUNTER — Other Ambulatory Visit (HOSPITAL_BASED_OUTPATIENT_CLINIC_OR_DEPARTMENT_OTHER): Payer: Self-pay

## 2024-11-24 DIAGNOSIS — R059 Cough, unspecified: Secondary | ICD-10-CM | POA: Diagnosis not present

## 2024-11-24 DIAGNOSIS — R911 Solitary pulmonary nodule: Secondary | ICD-10-CM | POA: Insufficient documentation

## 2024-11-24 DIAGNOSIS — J45909 Unspecified asthma, uncomplicated: Secondary | ICD-10-CM | POA: Diagnosis not present

## 2024-11-24 DIAGNOSIS — R0602 Shortness of breath: Secondary | ICD-10-CM | POA: Insufficient documentation

## 2024-11-24 DIAGNOSIS — J189 Pneumonia, unspecified organism: Secondary | ICD-10-CM

## 2024-11-24 DIAGNOSIS — R072 Precordial pain: Secondary | ICD-10-CM | POA: Insufficient documentation

## 2024-11-24 LAB — CBC WITH DIFFERENTIAL/PLATELET
Abs Immature Granulocytes: 0.08 K/uL — ABNORMAL HIGH (ref 0.00–0.07)
Basophils Absolute: 0 K/uL (ref 0.0–0.1)
Basophils Relative: 0 %
Eosinophils Absolute: 0.4 K/uL (ref 0.0–0.5)
Eosinophils Relative: 4 %
HCT: 34.6 % — ABNORMAL LOW (ref 36.0–46.0)
Hemoglobin: 11.7 g/dL — ABNORMAL LOW (ref 12.0–15.0)
Immature Granulocytes: 1 %
Lymphocytes Relative: 39 %
Lymphs Abs: 4.2 K/uL — ABNORMAL HIGH (ref 0.7–4.0)
MCH: 30.4 pg (ref 26.0–34.0)
MCHC: 33.8 g/dL (ref 30.0–36.0)
MCV: 89.9 fL (ref 80.0–100.0)
Monocytes Absolute: 1 K/uL (ref 0.1–1.0)
Monocytes Relative: 9 %
Neutro Abs: 5.2 K/uL (ref 1.7–7.7)
Neutrophils Relative %: 47 %
Platelets: 338 K/uL (ref 150–400)
RBC: 3.85 MIL/uL — ABNORMAL LOW (ref 3.87–5.11)
RDW: 13.5 % (ref 11.5–15.5)
WBC: 10.9 K/uL — ABNORMAL HIGH (ref 4.0–10.5)
nRBC: 0 % (ref 0.0–0.2)

## 2024-11-24 LAB — COMPREHENSIVE METABOLIC PANEL WITH GFR
ALT: 32 U/L (ref 0–44)
AST: 28 U/L (ref 15–41)
Albumin: 4 g/dL (ref 3.5–5.0)
Alkaline Phosphatase: 93 U/L (ref 38–126)
Anion gap: 12 (ref 5–15)
BUN: 15 mg/dL (ref 6–20)
CO2: 23 mmol/L (ref 22–32)
Calcium: 8.6 mg/dL — ABNORMAL LOW (ref 8.9–10.3)
Chloride: 108 mmol/L (ref 98–111)
Creatinine, Ser: 0.49 mg/dL (ref 0.44–1.00)
GFR, Estimated: >60 mL/min
Glucose, Bld: 119 mg/dL — ABNORMAL HIGH (ref 70–99)
Potassium: 3.6 mmol/L (ref 3.5–5.1)
Sodium: 143 mmol/L (ref 135–145)
Total Bilirubin: 0.4 mg/dL (ref 0.0–1.2)
Total Protein: 6.2 g/dL — ABNORMAL LOW (ref 6.5–8.1)

## 2024-11-24 LAB — TROPONIN T, HIGH SENSITIVITY
Troponin T High Sensitivity: <15 ng/L (ref 0–19)
Troponin T High Sensitivity: <15 ng/L (ref 0–19)

## 2024-11-24 LAB — LIPASE, BLOOD: Lipase: 27 U/L (ref 11–51)

## 2024-11-24 LAB — PRO BRAIN NATRIURETIC PEPTIDE: Pro Brain Natriuretic Peptide: 191 pg/mL

## 2024-11-24 LAB — D-DIMER, QUANTITATIVE: D-Dimer, Quant: 0.68 ug{FEU}/mL — ABNORMAL HIGH (ref 0.00–0.50)

## 2024-11-24 MED ORDER — PREDNISONE 10 MG (21) PO TBPK: ORAL_TABLET | Freq: Every day | ORAL | 0 refills | Status: AC

## 2024-11-24 MED ORDER — IOHEXOL 350 MG/ML SOLN
75.0000 mL | Freq: Once | INTRAVENOUS | Status: AC | PRN
  Administered 2024-11-24: 75 mL via INTRAVENOUS

## 2024-11-24 MED ORDER — LORAZEPAM 2 MG/ML IJ SOLN
1.0000 mg | Freq: Once | INTRAMUSCULAR | Status: AC
  Administered 2024-11-24: 1 mg via INTRAVENOUS
  Filled 2024-11-24: qty 1

## 2024-11-24 MED ORDER — PROMETHAZINE-DM 6.25-15 MG/5ML PO SYRP: 5.0000 mL | ORAL_SOLUTION | Freq: Four times a day (QID) | ORAL | 0 refills | Status: AC | PRN

## 2024-11-24 MED ORDER — IPRATROPIUM-ALBUTEROL 0.5-2.5 (3) MG/3ML IN SOLN
3.0000 mL | Freq: Once | RESPIRATORY_TRACT | Status: AC
  Administered 2024-11-24: 3 mL via RESPIRATORY_TRACT
  Filled 2024-11-24: qty 3

## 2024-11-24 NOTE — ED Provider Notes (Signed)
 "  Emergency Department Provider Note   I have reviewed the triage vital signs and the nursing notes.   HISTORY  Chief Complaint Shortness of Breath   HPI Nancy Richards is a 59 y.o. female with past history reviewed below including asthma presents to the emergency department with continued shortness of breath with tightness in the chest along with cough.  Symptoms began on 12/30.  She was seen virtually and started on antibiotics (Augmentin ) and 5 days of steroid which she completed yesterday.  She had worsening symptoms last night and ultimately called EMS.  She took multiple nebulizer treatments and had some mild relief.  She continued to feel poorly in the morning but went to work but ultimately had to leave due to worsening shortness of breath and coughing.  She feels a bandlike tightness across the upper abdomen/lower chest with some associated heaviness in the center of the chest.    Past Medical History:  Diagnosis Date   Allergic rhinitis    Anxiety    Asthma    Dysrhythmia    PACs - tx with metoprolol    Esophageal reflux    Fracture of fibula, distal, right, closed    History of cold sores    Infertility, female    Seasonal allergies     Review of Systems  Constitutional: No fever/chills Cardiovascular: Positive chest pain. Respiratory: Positive shortness of breath and cough.  Gastrointestinal: No abdominal pain.  No nausea, no vomiting. Skin: Negative for rash. Neurological: Negative for headaches.  ____________________________________________   PHYSICAL EXAM:  VITAL SIGNS: ED Triage Vitals [11/24/24 1934]  Encounter Vitals Group     BP (!) 160/87     Pulse Rate (!) 112     Resp 20     Temp 97.6 F (36.4 C)     Temp Source Oral     SpO2 99 %     Weight 194 lb 0.1 oz (88 kg)     Height 5' 4 (1.626 m)   Constitutional: Alert and oriented. Positive frequent cough with audible congestion.  Eyes: Conjunctivae are normal.  Head: Atraumatic. Nose:  Positive congestion/rhinnorhea. Mouth/Throat: Mucous membranes are moist.  Oropharynx non-erythematous. Neck: No stridor.   Cardiovascular: Sinus tachycardia. Good peripheral circulation. Grossly normal heart sounds.   Respiratory: Increased respiratory effort.  No retractions. Lungs without severe wheezing but slightly diminished at the right base.  Gastrointestinal: Soft with mild mid-epigastric tenderness. No distention.  Musculoskeletal: No gross deformities of extremities. Neurologic:  Normal speech and language.  Skin:  Skin is warm, dry and intact. No rash noted.  ____________________________________________   LABS (all labs ordered are listed, but only abnormal results are displayed)  Labs Reviewed  COMPREHENSIVE METABOLIC PANEL WITH GFR - Abnormal; Notable for the following components:      Result Value   Glucose, Bld 119 (*)    Calcium 8.6 (*)    Total Protein 6.2 (*)    All other components within normal limits  CBC WITH DIFFERENTIAL/PLATELET - Abnormal; Notable for the following components:   WBC 10.9 (*)    RBC 3.85 (*)    Hemoglobin 11.7 (*)    HCT 34.6 (*)    Lymphs Abs 4.2 (*)    Abs Immature Granulocytes 0.08 (*)    All other components within normal limits  D-DIMER, QUANTITATIVE - Abnormal; Notable for the following components:   D-Dimer, Quant 0.68 (*)    All other components within normal limits  PRO BRAIN NATRIURETIC PEPTIDE  LIPASE,  BLOOD  TROPONIN T, HIGH SENSITIVITY  TROPONIN T, HIGH SENSITIVITY   ____________________________________________  EKG   EKG Interpretation Date/Time:  Tuesday November 24 2024 19:59:41 EST Ventricular Rate:  89 PR Interval:  165 QRS Duration:  91 QT Interval:  361 QTC Calculation: 440 R Axis:   62  Text Interpretation: Sinus rhythm Confirmed by Darra Chew [phone]) on 11/24/2024 8:04:51 PM        ____________________________________________  RADIOLOGY  CT Angio Chest PE W and/or Wo Contrast Result Date:  11/24/2024 EXAM: CTA of the Chest with contrast for PE 11/24/2024 09:02:01 PM TECHNIQUE: CTA of the chest was performed after the administration of 75 mL of iohexol  (OMNIPAQUE ) 350 MG/ML injection. Multiplanar reformatted images are provided for review. MIP images are provided for review. Automated exposure control, iterative reconstruction, and/or weight based adjustment of the mA/kV was utilized to reduce the radiation dose to as low as reasonably achievable. COMPARISON: 07/05/2006 CLINICAL HISTORY: Pulmonary embolism (PE) suspected, low to intermediate prob, positive D-dimer. Cough, aggressive dyspnea, upper respiratory infection. FINDINGS: PULMONARY ARTERIES: Pulmonary arteries are adequately opacified for evaluation. No pulmonary embolism. Main pulmonary artery is normal in caliber. MEDIASTINUM: The heart and pericardium demonstrate no acute abnormality. Mild atherosclerotic calcification within the thoracic aorta. There is no acute abnormality of the thoracic aorta. LYMPH NODES: No mediastinal, hilar or axillary lymphadenopathy. LUNGS AND PLEURA: 9 mm pulmonary nodule, right costophrenic sulcus (series 114, image 12), indeterminate. This appears new from prior examination. No focal consolidation or pulmonary edema. No pleural effusion or pneumothorax. UPPER ABDOMEN: Status post cholecystectomy. SOFT TISSUES AND BONES: No acute bone or soft tissue abnormality. IMPRESSION: 1. No pulmonary embolism. 2. New 9 mm right costophrenic sulcus pulmonary nodule; recommend consideration of non-contrast chest CT at 3 months, PET/CT, or tissue sampling as per Fleischner Society Guidelines. 3. Status post cholecystectomy. 4. RAF score includes aortic atherosclerosis ([id number]). Electronically signed by: Dorethia Molt MD 11/24/2024 09:16 PM EST RP Workstation: [id number]   DG Chest 2 View Result Date: 11/24/2024 EXAM: 2 VIEW(S) XRAY OF THE CHEST 11/24/2024 08:40:00 PM COMPARISON: None available. CLINICAL HISTORY: The  patient presents with chest pain and shortness of breath. FINDINGS: LUNGS AND PLEURA: No focal pulmonary opacity. No pleural effusion. No pneumothorax. HEART AND MEDIASTINUM: No acute abnormality of the cardiac and mediastinal silhouettes. BONES AND SOFT TISSUES: No acute osseous abnormality. IMPRESSION: 1. No acute cardiopulmonary abnormality. Electronically signed by: Dorethia Molt MD 11/24/2024 08:44 PM EST RP Workstation: [id number]    ____________________________________________   PROCEDURES  Procedure(s) performed:   Procedures  None  ____________________________________________   INITIAL IMPRESSION / ASSESSMENT AND PLAN / ED COURSE  Pertinent labs & imaging results that were available during my care of the patient were reviewed by me and considered in my medical decision making (see chart for details).   This patient is Presenting for Evaluation of CP, which does require a range of treatment options, and is a complaint that involves a high risk of morbidity and mortality.  The Differential Diagnoses includes but is not exclusive to acute coronary syndrome, aortic dissection, pulmonary embolism, cardiac tamponade, community-acquired pneumonia, pericarditis, musculoskeletal chest wall pain, etc.   Critical Interventions-    Medications  ipratropium-albuterol  (DUONEB) 0.5-2.5 (3) MG/3ML nebulizer solution 3 mL (3 mLs Nebulization Given 11/24/24 2007)  LORazepam  (ATIVAN ) injection 1 mg (1 mg Intravenous Given 11/24/24 1959)  iohexol  (OMNIPAQUE ) 350 MG/ML injection 75 mL (75 mLs Intravenous Contrast Given 11/24/24 2048)    Reassessment after intervention:  Symptoms improved.  Clinical Laboratory Tests Ordered, included troponin negative x 2.  CBC with mild leukocytosis to 10.9.  Platelets normal.  No acute kidney injury.  LFTs and bilirubin normal along with lipase.  D-dimer elevated to 0.68. pro BNP normal.   Radiologic Tests Ordered, included CXR. I independently interpreted  the images and agree with radiology interpretation.   Cardiac Monitor Tracing which shows sinus tachycardia.    Social Determinants of Health Risk patient is not an active smoker.  Medical Decision Making: Summary:  Patient presents emergency department with shortness of breath and chest tightness in the setting of URI symptoms.  She has completed a steroid burst for 5 days and round of Augmentin  with worsening symptoms.  Having chest pain today.  Cough is more bronchospastic.  With relatively clear lungs and tachycardia I have also added on a D-dimer but lower suspicion overall for PE.  Reevaluation with update and discussion with patient. Troponin negative. Discussed CTA findings including pulmonary nodule. Plan for steroid burst with taper and Pulmonary follow up.   Considered admission but ED workup is reassuring.   Patient's presentation is most consistent with acute presentation with potential threat to life or bodily function.   Disposition: discharge  ____________________________________________  FINAL CLINICAL IMPRESSION(S) / ED DIAGNOSES  Final diagnoses:  Precordial chest pain  SOB (shortness of breath)  Pulmonary nodule, right     NEW OUTPATIENT MEDICATIONS STARTED DURING THIS VISIT:  New Prescriptions   PREDNISONE  (STERAPRED UNI-PAK 21 TAB) 10 MG (21) TBPK TABLET    Take by mouth daily. Take 6 tabs by mouth daily  for 2 days, then 5 tabs for 2 days, then 4 tabs for 2 days, then 3 tabs for 2 days, 2 tabs for 2 days, then 1 tab by mouth daily for 2 days    Note:  This document was prepared using Dragon voice recognition software and may include unintentional dictation errors.  Fonda Law, MD, [facility] Emergency Medicine    Zaven Klemens, Fonda MATSU, MD 11/24/24 [phone]  "

## 2024-11-24 NOTE — ED Notes (Signed)

## 2024-11-24 NOTE — ED Triage Notes (Addendum)
 Pt had URI starting 12/30, was worse by 1/1. Seen by video provider and given ABX. Cough and SOB progressively getting worse. Tightness along chest below breast line.

## 2024-11-24 NOTE — Discharge Instructions (Addendum)
 I am starting you on steroids at a slightly higher dose with a taper. I would like you to call your PCP and your Pulmonary doctor for a close follow up. Continue your home medications. You also have a 9mm pulmonary nodule at the right lung base. Radiology is recommending repeat imaging in 3 months. Your PCP can help arrange this.   Hope you feel better!!!

## 2024-11-25 ENCOUNTER — Other Ambulatory Visit (HOSPITAL_BASED_OUTPATIENT_CLINIC_OR_DEPARTMENT_OTHER): Payer: Self-pay

## 2024-11-25 MED ORDER — IPRATROPIUM-ALBUTEROL 0.5-2.5 (3) MG/3ML IN SOLN
3.0000 mL | Freq: Four times a day (QID) | RESPIRATORY_TRACT | 1 refills | Status: AC
  Filled 2024-11-25: qty 180, 30d supply, fill #0

## 2024-11-26 ENCOUNTER — Other Ambulatory Visit (HOSPITAL_BASED_OUTPATIENT_CLINIC_OR_DEPARTMENT_OTHER): Payer: Self-pay

## 2024-11-27 ENCOUNTER — Other Ambulatory Visit: Payer: Self-pay

## 2024-11-27 ENCOUNTER — Other Ambulatory Visit (HOSPITAL_BASED_OUTPATIENT_CLINIC_OR_DEPARTMENT_OTHER): Payer: Self-pay

## 2024-11-27 MED ORDER — CLONAZEPAM 1 MG PO TABS
1.0000 mg | ORAL_TABLET | Freq: Every day | ORAL | 0 refills | Status: AC
  Filled 2024-11-27: qty 30, 30d supply, fill #0
